# Patient Record
Sex: Female | Born: 1955 | Race: White | Hispanic: No | Marital: Married | Smoking: Never smoker
Health system: Southern US, Community
[De-identification: ages and names within clinical notes are randomized; demographics above are authoritative.]

## PROBLEM LIST (undated history)

## (undated) DIAGNOSIS — I10 Essential (primary) hypertension: Secondary | ICD-10-CM

## (undated) DIAGNOSIS — E785 Hyperlipidemia, unspecified: Secondary | ICD-10-CM

## (undated) DIAGNOSIS — E119 Type 2 diabetes mellitus without complications: Secondary | ICD-10-CM

## (undated) HISTORY — DX: Type 2 diabetes mellitus without complications: E11.9

## (undated) HISTORY — DX: Hyperlipidemia, unspecified: E78.5

---

## 1999-01-21 ENCOUNTER — Other Ambulatory Visit: Admission: RE | Admit: 1999-01-21 | Discharge: 1999-01-21 | Payer: Self-pay | Admitting: Obstetrics and Gynecology

## 2000-04-19 ENCOUNTER — Other Ambulatory Visit: Admission: RE | Admit: 2000-04-19 | Discharge: 2000-04-19 | Payer: Self-pay | Admitting: Obstetrics and Gynecology

## 2000-04-21 ENCOUNTER — Encounter: Payer: Self-pay | Admitting: Obstetrics and Gynecology

## 2000-04-21 ENCOUNTER — Encounter: Admission: RE | Admit: 2000-04-21 | Discharge: 2000-04-21 | Payer: Self-pay | Admitting: Obstetrics and Gynecology

## 2001-05-28 ENCOUNTER — Other Ambulatory Visit: Admission: RE | Admit: 2001-05-28 | Discharge: 2001-05-28 | Payer: Self-pay | Admitting: Obstetrics and Gynecology

## 2001-06-25 ENCOUNTER — Encounter: Admission: RE | Admit: 2001-06-25 | Discharge: 2001-06-25 | Payer: Self-pay | Admitting: Obstetrics and Gynecology

## 2001-06-25 ENCOUNTER — Encounter: Payer: Self-pay | Admitting: Obstetrics and Gynecology

## 2001-11-20 ENCOUNTER — Encounter: Payer: Self-pay | Admitting: Family Medicine

## 2001-11-20 ENCOUNTER — Encounter: Admission: RE | Admit: 2001-11-20 | Discharge: 2001-11-20 | Payer: Self-pay | Admitting: Family Medicine

## 2002-08-08 ENCOUNTER — Encounter: Admission: RE | Admit: 2002-08-08 | Discharge: 2002-08-08 | Payer: Self-pay | Admitting: Obstetrics and Gynecology

## 2002-08-08 ENCOUNTER — Encounter: Payer: Self-pay | Admitting: Obstetrics and Gynecology

## 2002-10-16 ENCOUNTER — Other Ambulatory Visit: Admission: RE | Admit: 2002-10-16 | Discharge: 2002-10-16 | Payer: Self-pay | Admitting: Obstetrics and Gynecology

## 2003-10-31 ENCOUNTER — Encounter: Admission: RE | Admit: 2003-10-31 | Discharge: 2003-10-31 | Payer: Self-pay | Admitting: Obstetrics and Gynecology

## 2003-11-27 ENCOUNTER — Other Ambulatory Visit: Admission: RE | Admit: 2003-11-27 | Discharge: 2003-11-27 | Payer: Self-pay | Admitting: Obstetrics and Gynecology

## 2004-04-19 ENCOUNTER — Encounter: Admission: RE | Admit: 2004-04-19 | Discharge: 2004-05-17 | Payer: Self-pay

## 2005-04-06 ENCOUNTER — Encounter: Admission: RE | Admit: 2005-04-06 | Discharge: 2005-04-06 | Payer: Self-pay | Admitting: Obstetrics and Gynecology

## 2005-04-07 ENCOUNTER — Other Ambulatory Visit: Admission: RE | Admit: 2005-04-07 | Discharge: 2005-04-07 | Payer: Self-pay | Admitting: Obstetrics and Gynecology

## 2005-04-11 ENCOUNTER — Ambulatory Visit (HOSPITAL_COMMUNITY): Admission: RE | Admit: 2005-04-11 | Discharge: 2005-04-11 | Payer: Self-pay | Admitting: Obstetrics and Gynecology

## 2005-04-11 IMAGING — US US TRANSVAGINAL NON-OB
1 series · 14 of 25 positions shown · non-contrast
Comparison: none

CLINICAL DATA: Right lower quadrant pain.  Not pregnant.
 TRANSABDOMINAL AND TRANSVAGINAL PELVIC ULTRASOUND:
TECHNIQUE: Both transabdominal and transvaginal ultrasound examinations of the pelvis were performed including evaluation of the uterus, ovaries, adnexal regions, and pelvic cul-de-sac.
 The uterus measures 8.2 x 4.4 x 3.6 cm and contains two predominantly intramural fibroids in the anterior uterine body, the larger of which measures 1.1 x 0.9 x 0.9 cm.  No appreciable mass effect upon the endometrial stripe is seen.  The endometrial stripe measures 9 mm and is linear and echogenic in appearance.  There is a 1.8 x 1.7 cm thick-walled right ovarian cyst with low-level internal echoes.  Trace fluid is noted around the right ovary.  The left ovary is normal  No free fluid in the cul-de-sac.

[Series 1: us transvaginal non-ob · 0.31mm/px · 14 of 52 slices shown]
[im 1/52]
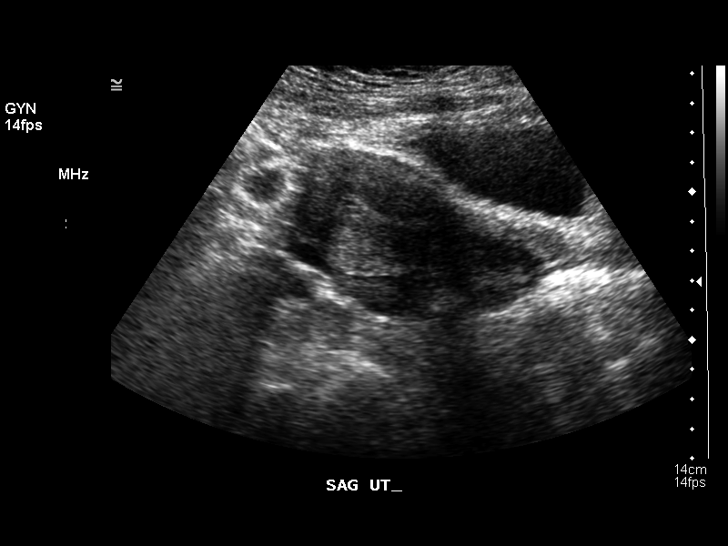
[im 5/52]
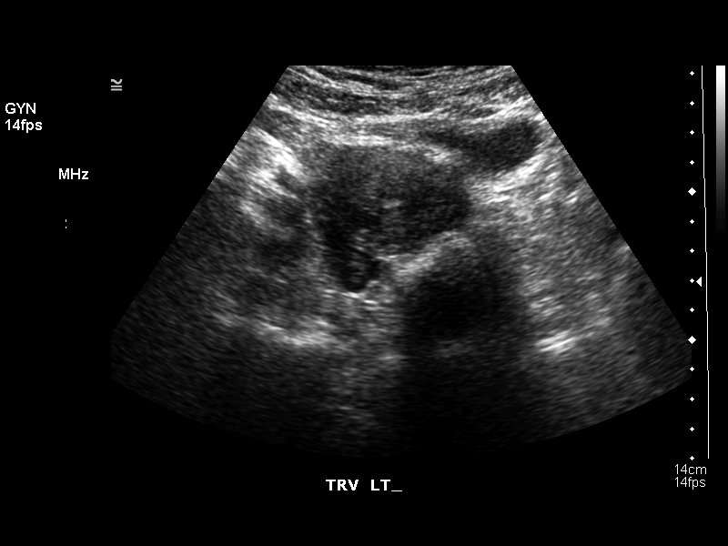
[im 9/52]
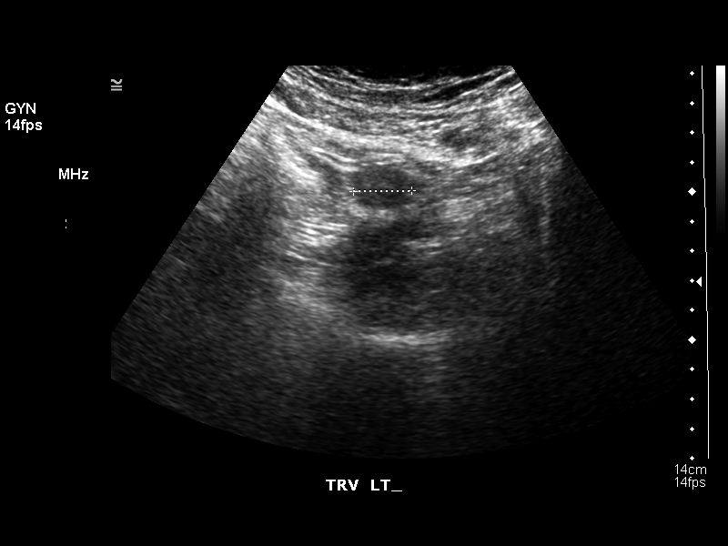
[im 13/52]
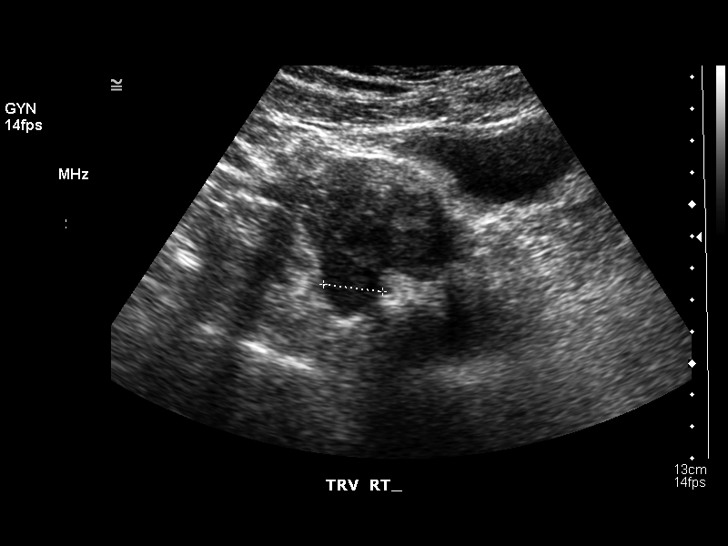
[im 18/52]
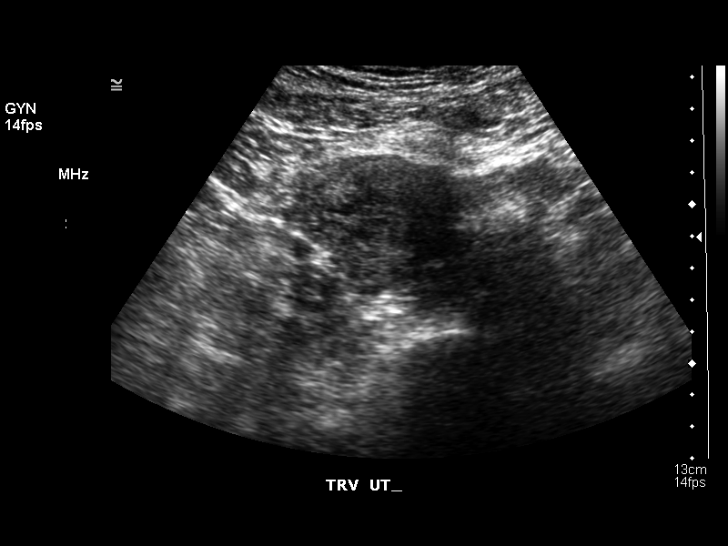
[im 20/52]
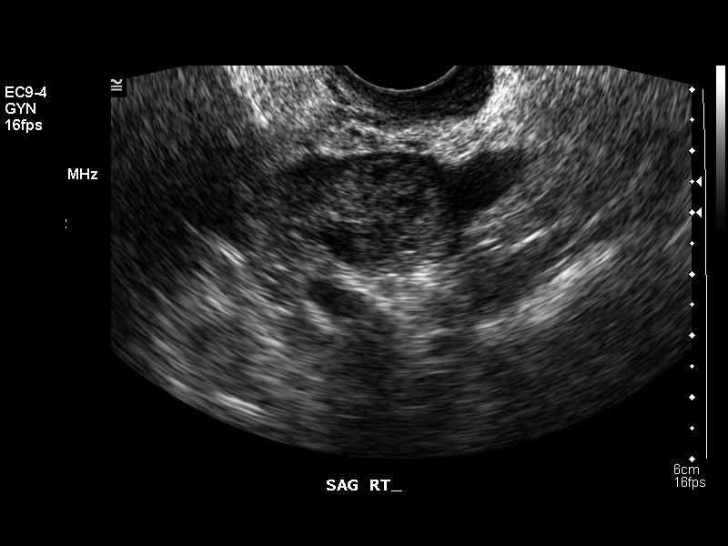
[im 24/52]
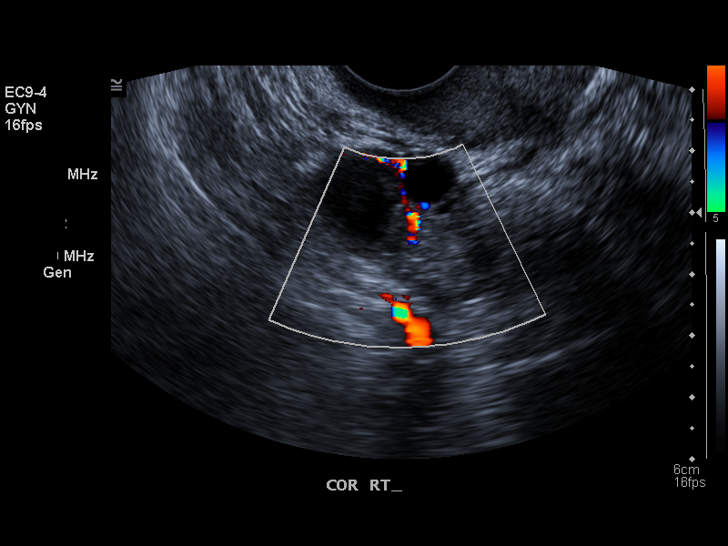
[im 28/52]
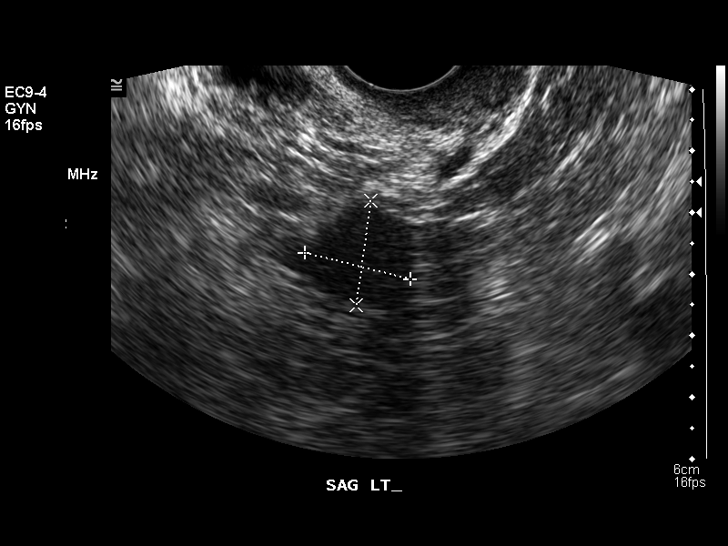
[im 32/52]
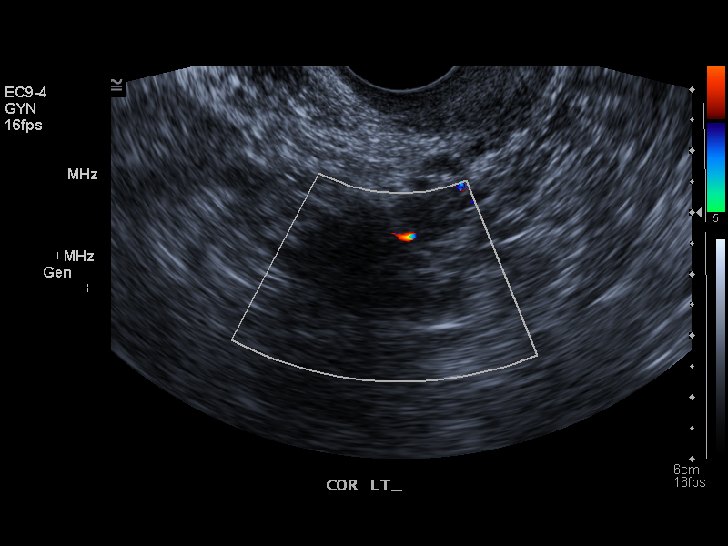
[im 35/52]
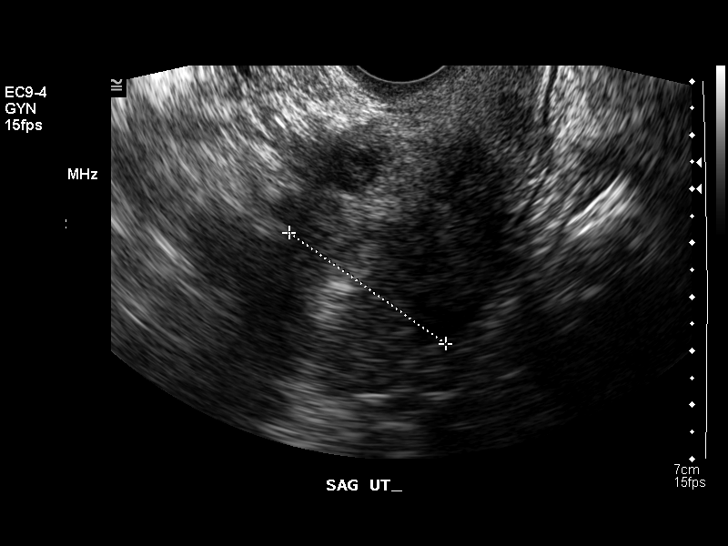
[im 39/52]
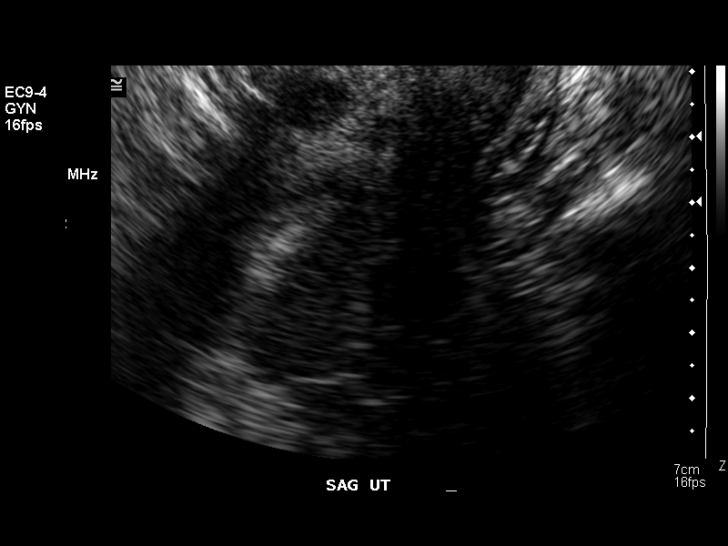
[im 43/52]
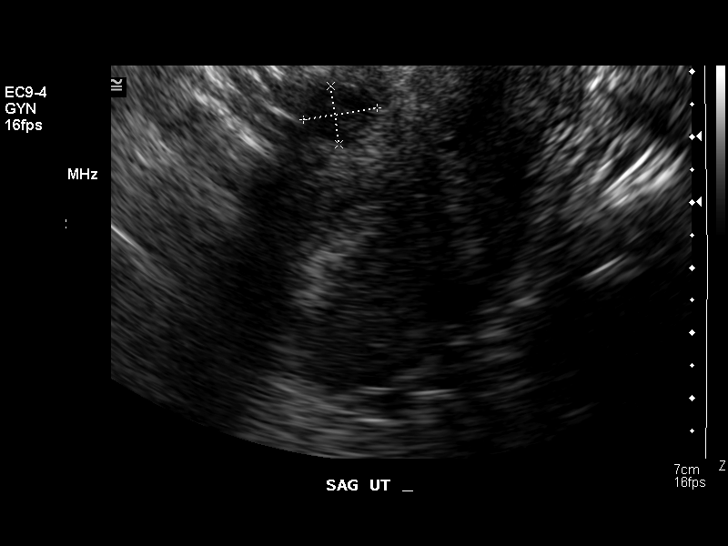
[im 47/52]
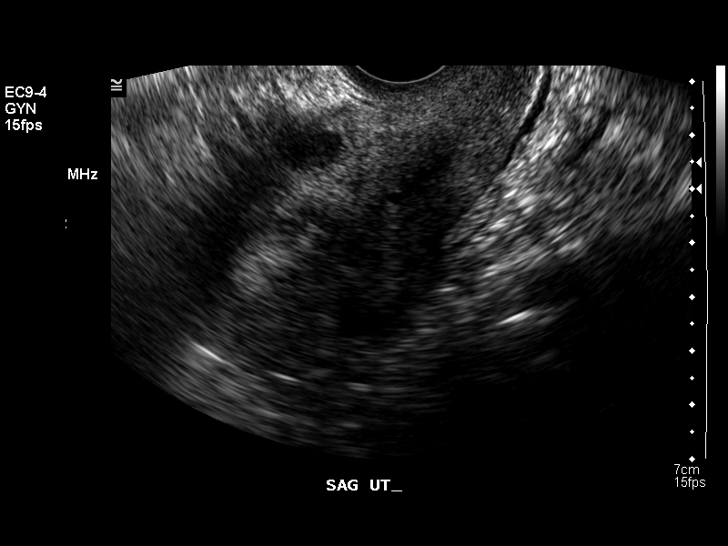
[im 52/52]
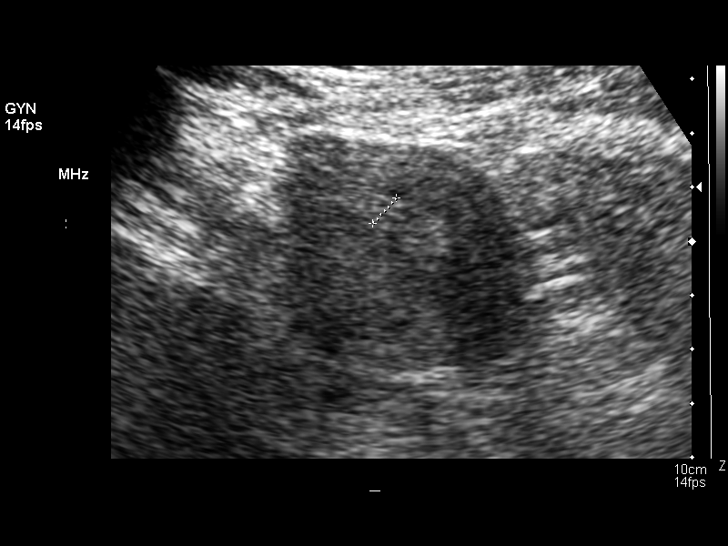

[14 of 25 positions shown; findings below may reference images not displayed]

IMPRESSION: 1.  1.8 x 1.7 cm complex right ovarian cyst with trace periovarian fluid; as the patient?s last menstrual period was [DATE], this may represent sequela of cyst rupture.  
 2.  Intramural uterine fibroids.

## 2005-06-10 ENCOUNTER — Ambulatory Visit (HOSPITAL_COMMUNITY): Admission: RE | Admit: 2005-06-10 | Discharge: 2005-06-10 | Payer: Self-pay | Admitting: Obstetrics and Gynecology

## 2005-06-10 IMAGING — US US TRANSVAGINAL NON-OB
1 series · 14 of 25 positions shown · non-contrast
Comparison: [DATE].

CLINICAL DATA: Right ovarian cyst.
 TRANSVAGINAL PELVIC ULTRASOUND:
TECHNIQUE: Transvaginal ultrasound examination of the pelvis was performed including evaluation of the uterus, ovaries, adnexal regions, and pelvic cul-de-sac.

[Series 1: us transvaginal non-ob · 0.17mm/px · 14 of 36 slices shown]
[im 1/36]
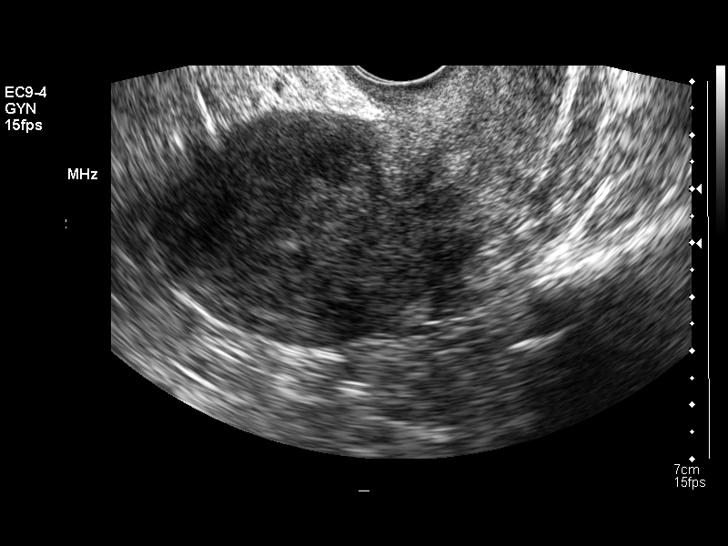
[im 3/36]
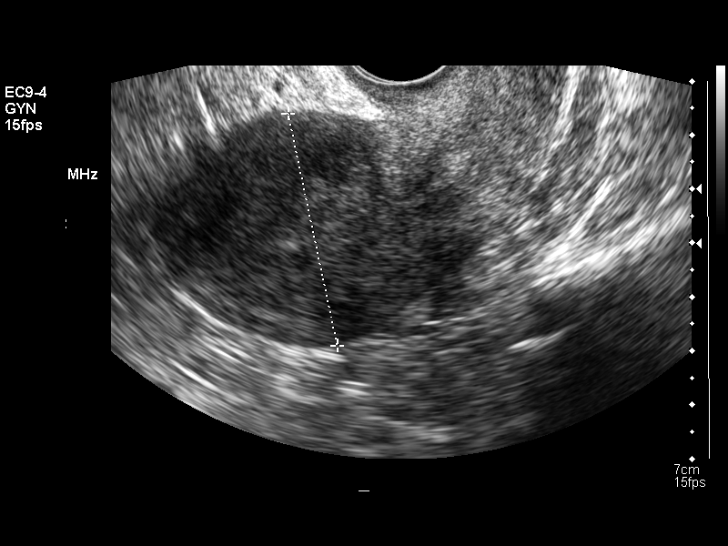
[im 6/36]
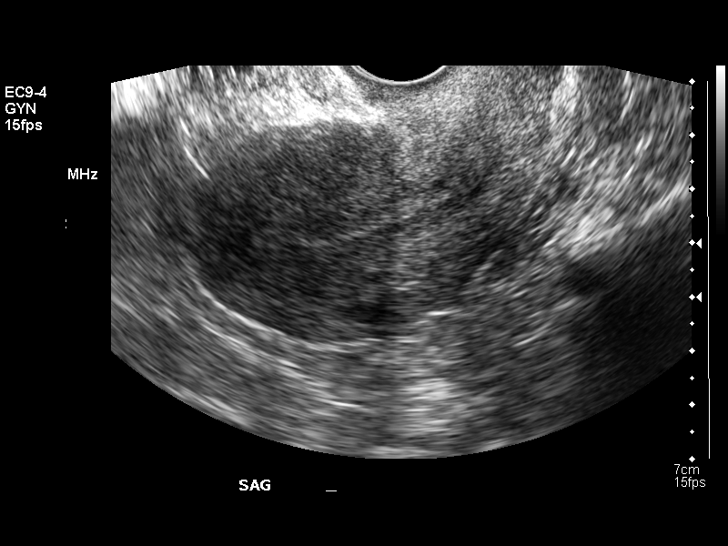
[im 9/36]
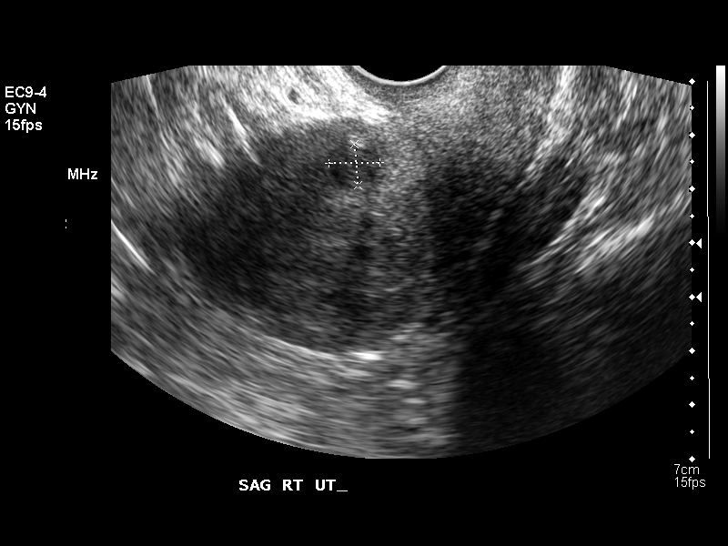
[im 12/36]
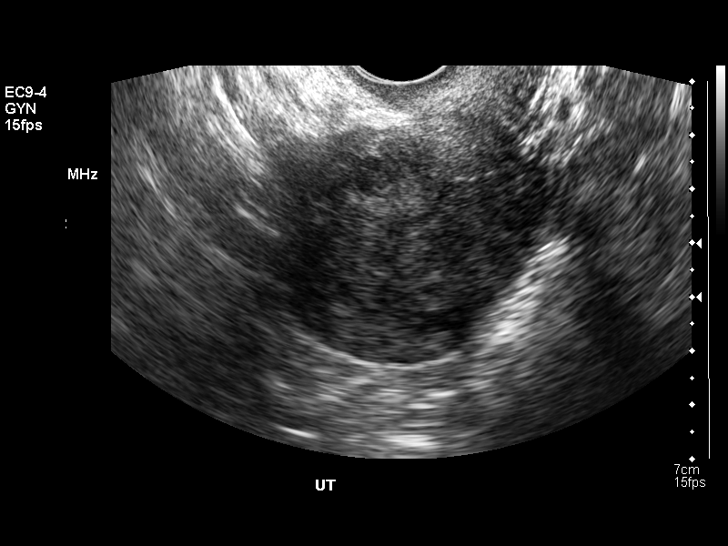
[im 14/36]
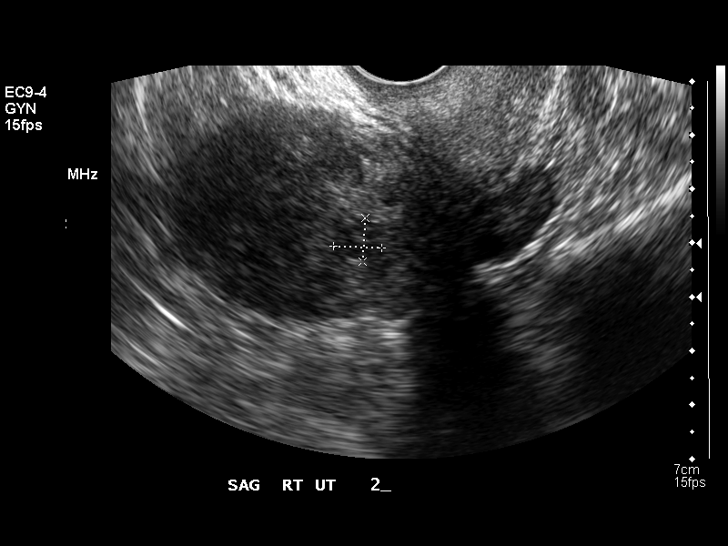
[im 17/36]
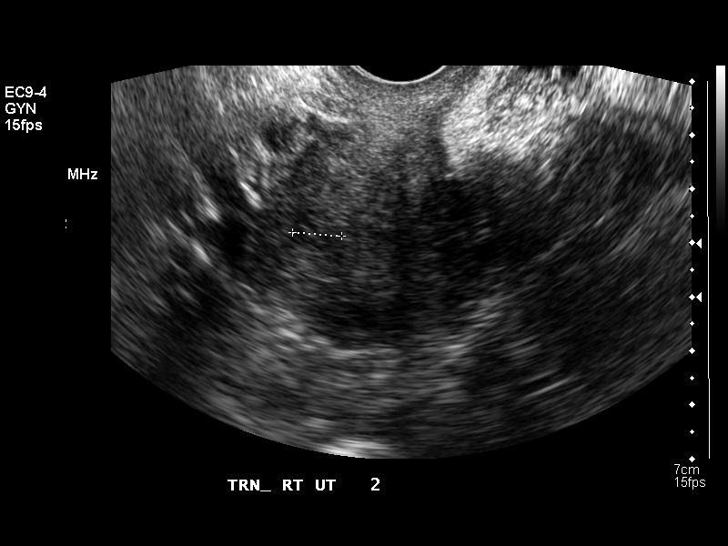
[im 19/36]
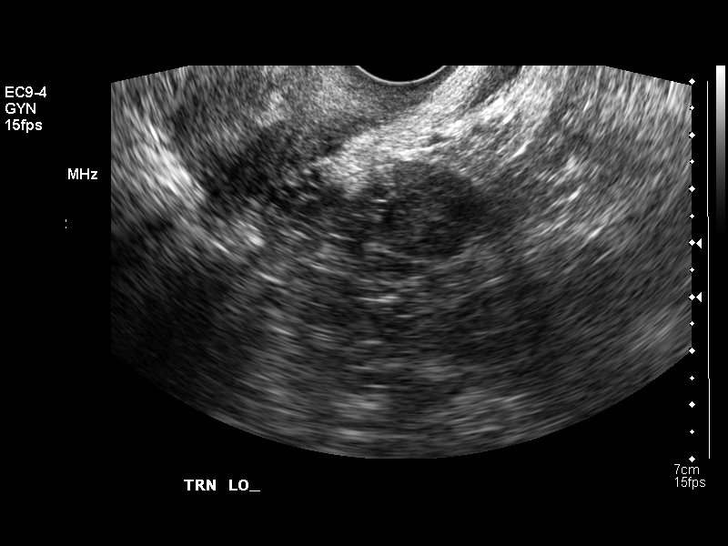
[im 22/36]
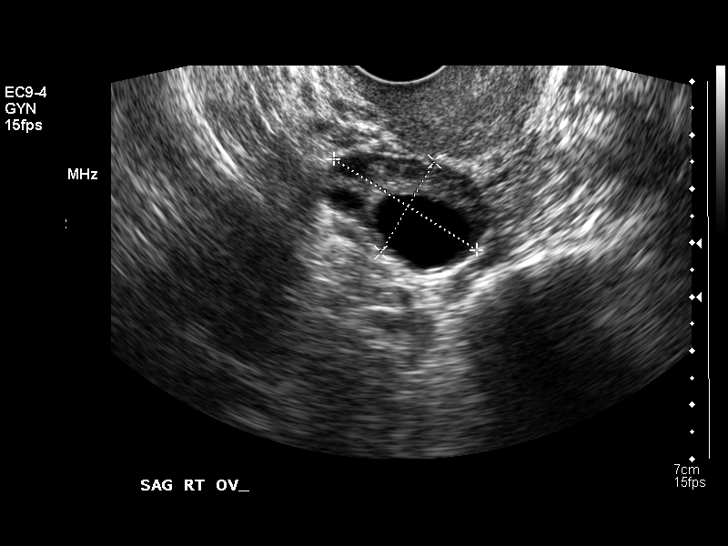
[im 24/36]
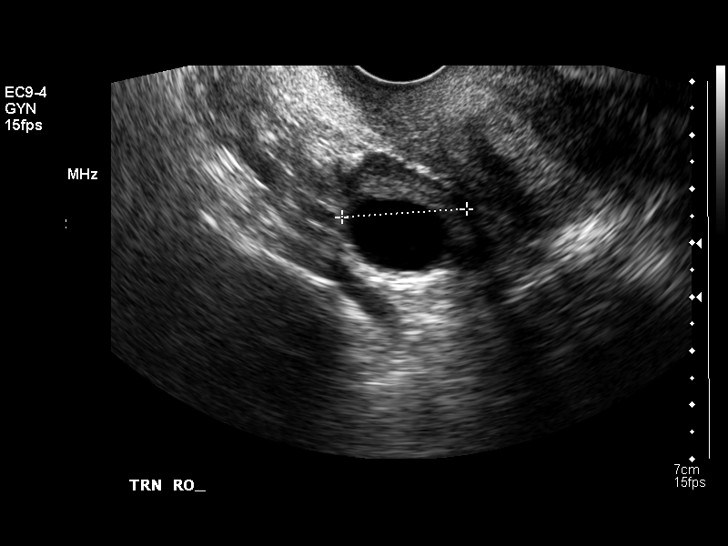
[im 27/36]
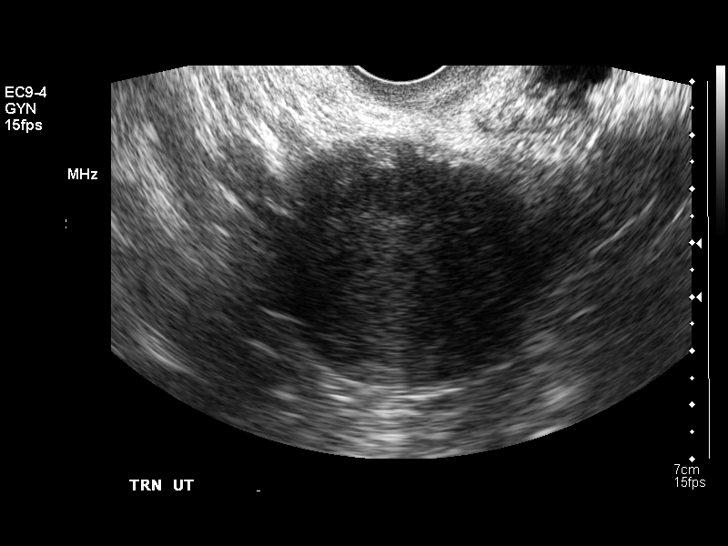
[im 30/36]
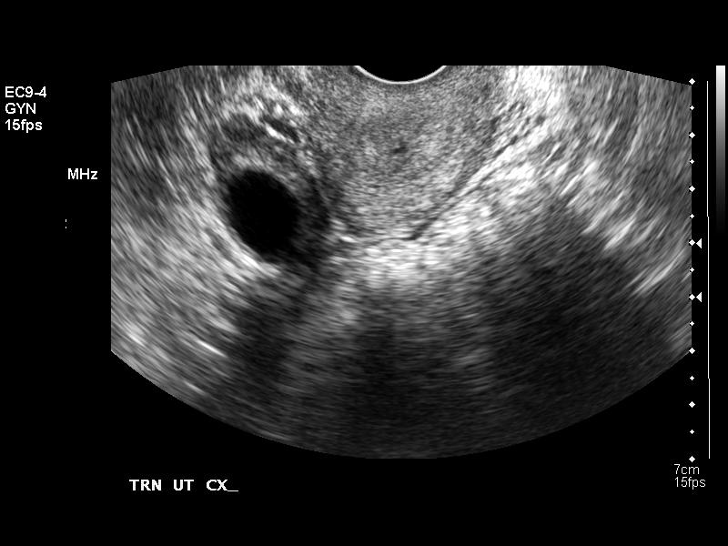
[im 33/36]
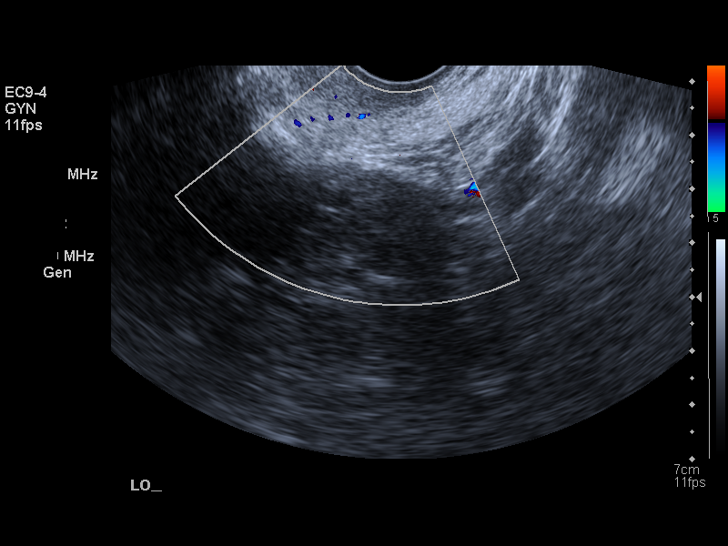
[im 36/36]
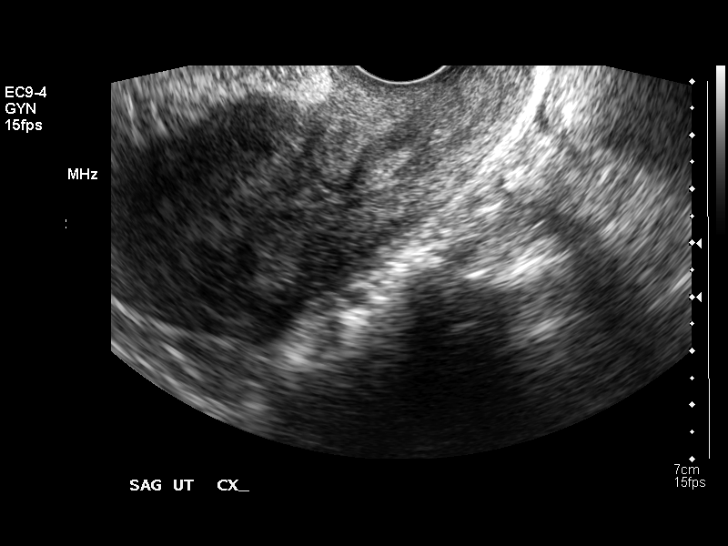

[14 of 25 positions shown; findings below may reference images not displayed]

FINDINGS: The uterus measures 8.2 x 4.4 x 4.2 cm.  The two small intramural fibroids seen previously persist. Endometrial stripe is 4-5 mm in thickness.  
 The right ovary is 2.1 x 2.0 x 2.3 cm.  A 17 mm simple cystic region within the ovarian parenchyma is compatible with a dominant follicle.  The left ovary measures 2.9 x 1.9 x 1.9 cm.  Sonographic appearance is normal.  There is no free fluid in the cul-de-sac.
IMPRESSION: 1.  Two small intramural fibroids.  The exam is otherwise normal.
 2.  The complex cyst in the right ovary seen previously has resolved with a probable dominant follicle centrally within the ovarian parenchyma today.

## 2005-07-08 ENCOUNTER — Encounter: Admission: RE | Admit: 2005-07-08 | Discharge: 2005-09-01 | Payer: Self-pay | Admitting: Family Medicine

## 2006-03-12 ENCOUNTER — Emergency Department (HOSPITAL_COMMUNITY): Admission: EM | Admit: 2006-03-12 | Discharge: 2006-03-12 | Payer: Self-pay | Admitting: Family Medicine

## 2006-04-07 ENCOUNTER — Encounter: Admission: RE | Admit: 2006-04-07 | Discharge: 2006-04-07 | Payer: Self-pay | Admitting: Obstetrics and Gynecology

## 2006-04-10 ENCOUNTER — Other Ambulatory Visit: Admission: RE | Admit: 2006-04-10 | Discharge: 2006-04-10 | Payer: Self-pay | Admitting: Obstetrics and Gynecology

## 2007-01-09 ENCOUNTER — Ambulatory Visit (HOSPITAL_COMMUNITY): Admission: RE | Admit: 2007-01-09 | Discharge: 2007-01-09 | Payer: Self-pay | Admitting: Gastroenterology

## 2007-01-09 ENCOUNTER — Encounter (INDEPENDENT_AMBULATORY_CARE_PROVIDER_SITE_OTHER): Payer: Self-pay | Admitting: Gastroenterology

## 2007-04-12 ENCOUNTER — Other Ambulatory Visit: Admission: RE | Admit: 2007-04-12 | Discharge: 2007-04-12 | Payer: Self-pay | Admitting: Obstetrics and Gynecology

## 2007-04-12 ENCOUNTER — Encounter: Admission: RE | Admit: 2007-04-12 | Discharge: 2007-04-12 | Payer: Self-pay | Admitting: Obstetrics and Gynecology

## 2007-04-12 IMAGING — MG MM SCREEN MAMMOGRAM BILATERAL
4 series · 4 of 4 positions shown · non-contrast
Comparison: none

DG SCREEN MAMMOGRAM BILATERAL
Bilateral CC and MLO view(s) were taken.

DIGITAL SCREENING MAMMOGRAM WITH CAD:
The breast tissue is heterogeneously dense.  No masses or malignant type calcifications are 
identified.  Compared with prior studies.

[R CC]
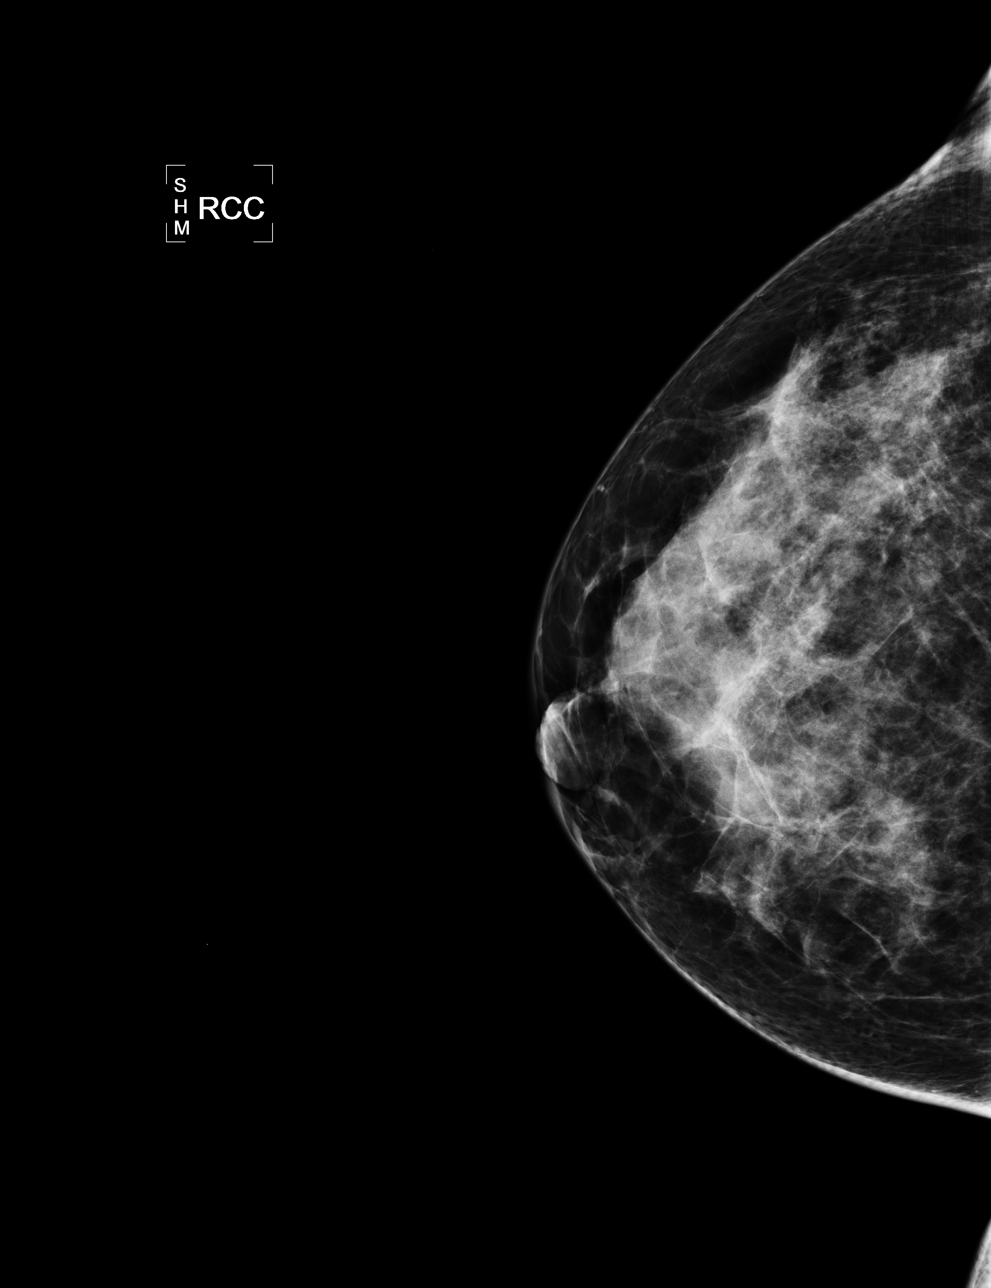

[L CC]
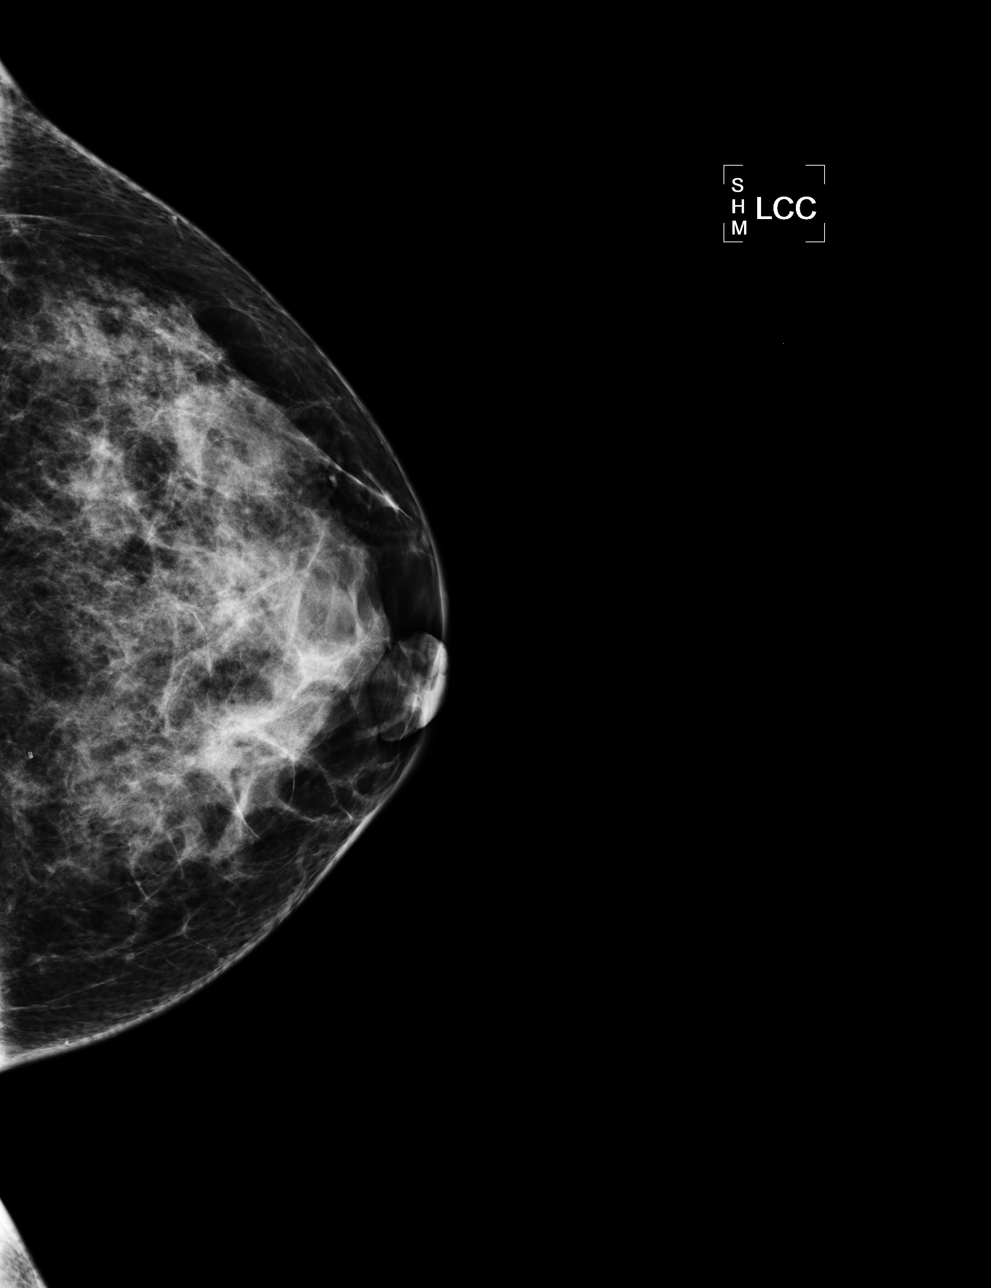

[L MLO]
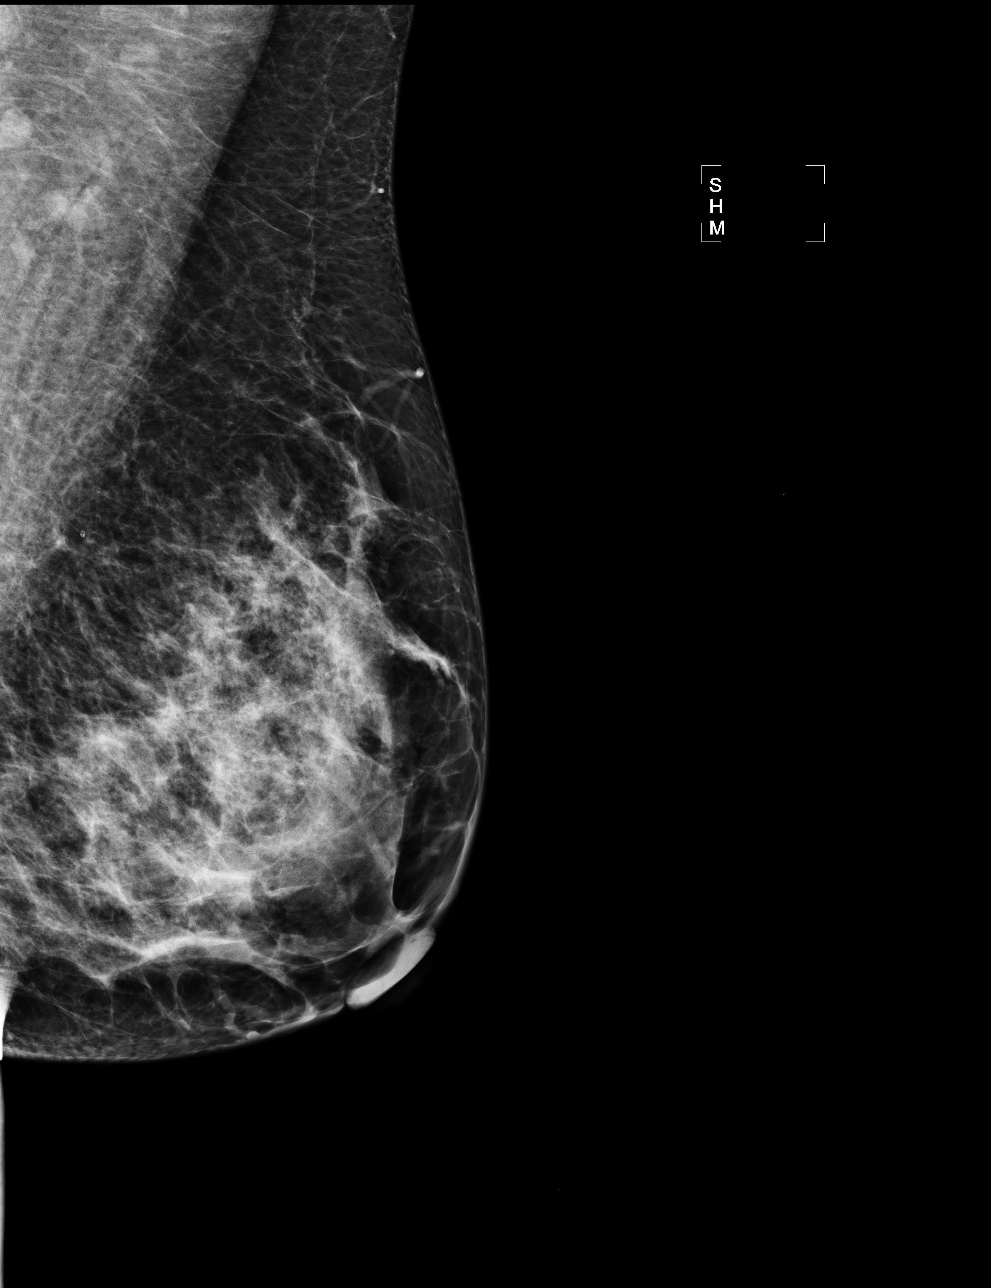

[R MLO]
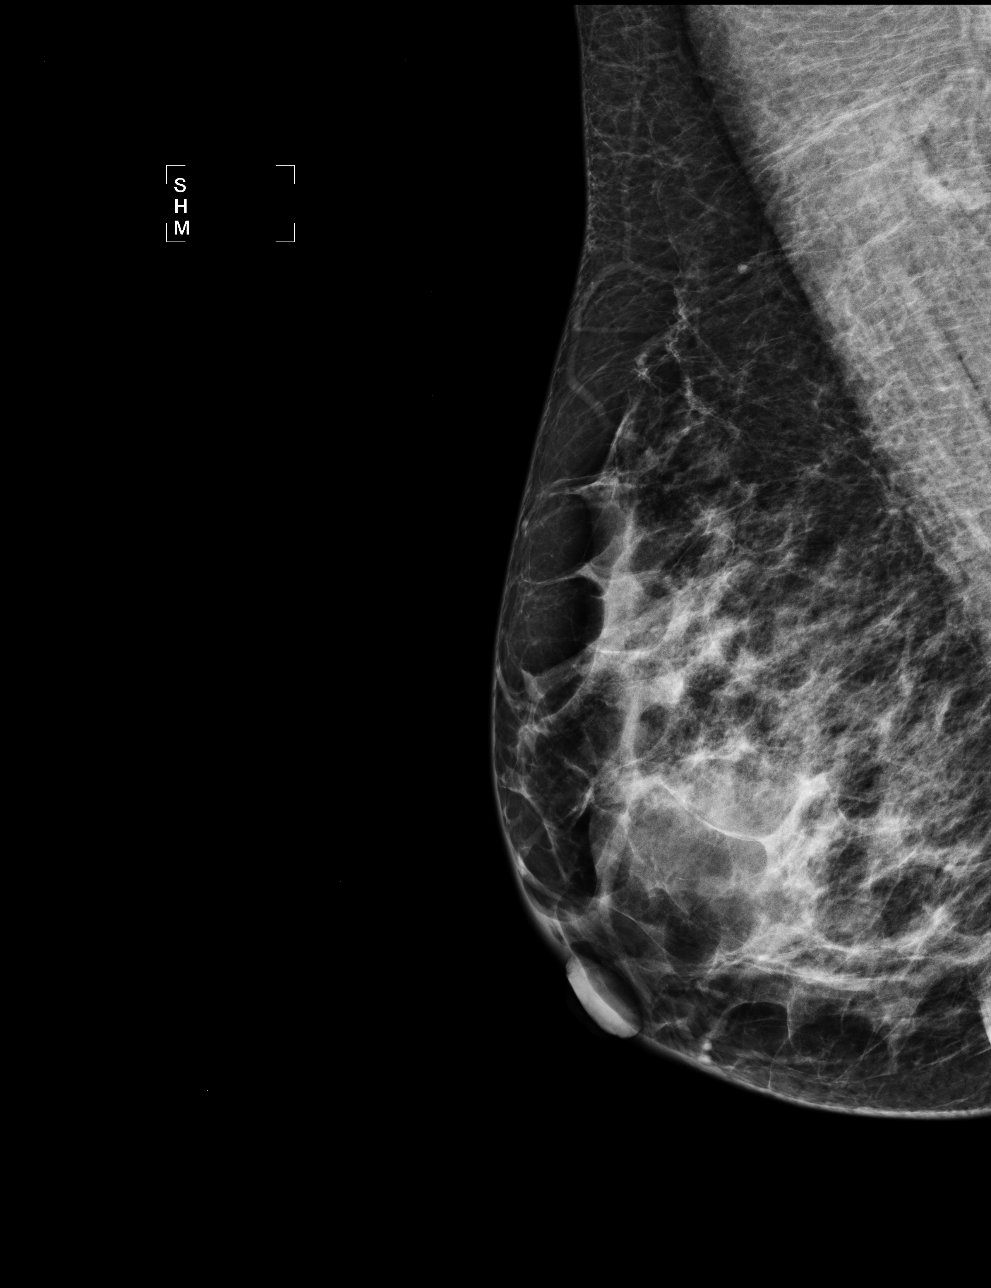

[4 of 4 positions shown; findings below may reference images not displayed]

IMPRESSION: No specific mammographic evidence of malignancy.  Next screening mammogram is recommended in one 
year.

ASSESSMENT: Negative - BI-RADS 1

Screening mammogram in 1 year.
ANALYZED BY COMPUTER AIDED DETECTION. , THIS PROCEDURE WAS A DIGITAL MAMMOGRAM.

## 2008-04-14 ENCOUNTER — Encounter: Admission: RE | Admit: 2008-04-14 | Discharge: 2008-04-14 | Payer: Self-pay | Admitting: Obstetrics and Gynecology

## 2008-04-14 IMAGING — MG MM SCREEN MAMMOGRAM BILATERAL
4 series · 4 of 4 positions shown · non-contrast
Comparison: none

DG SCREEN MAMMOGRAM BILATERAL
Bilateral CC and MLO view(s) were taken.

DIGITAL SCREENING MAMMOGRAM WITH CAD:
There are scattered fibroglandular densities.  No masses or malignant type calcifications are 
identified.  Compared with prior studies.

[R CC]
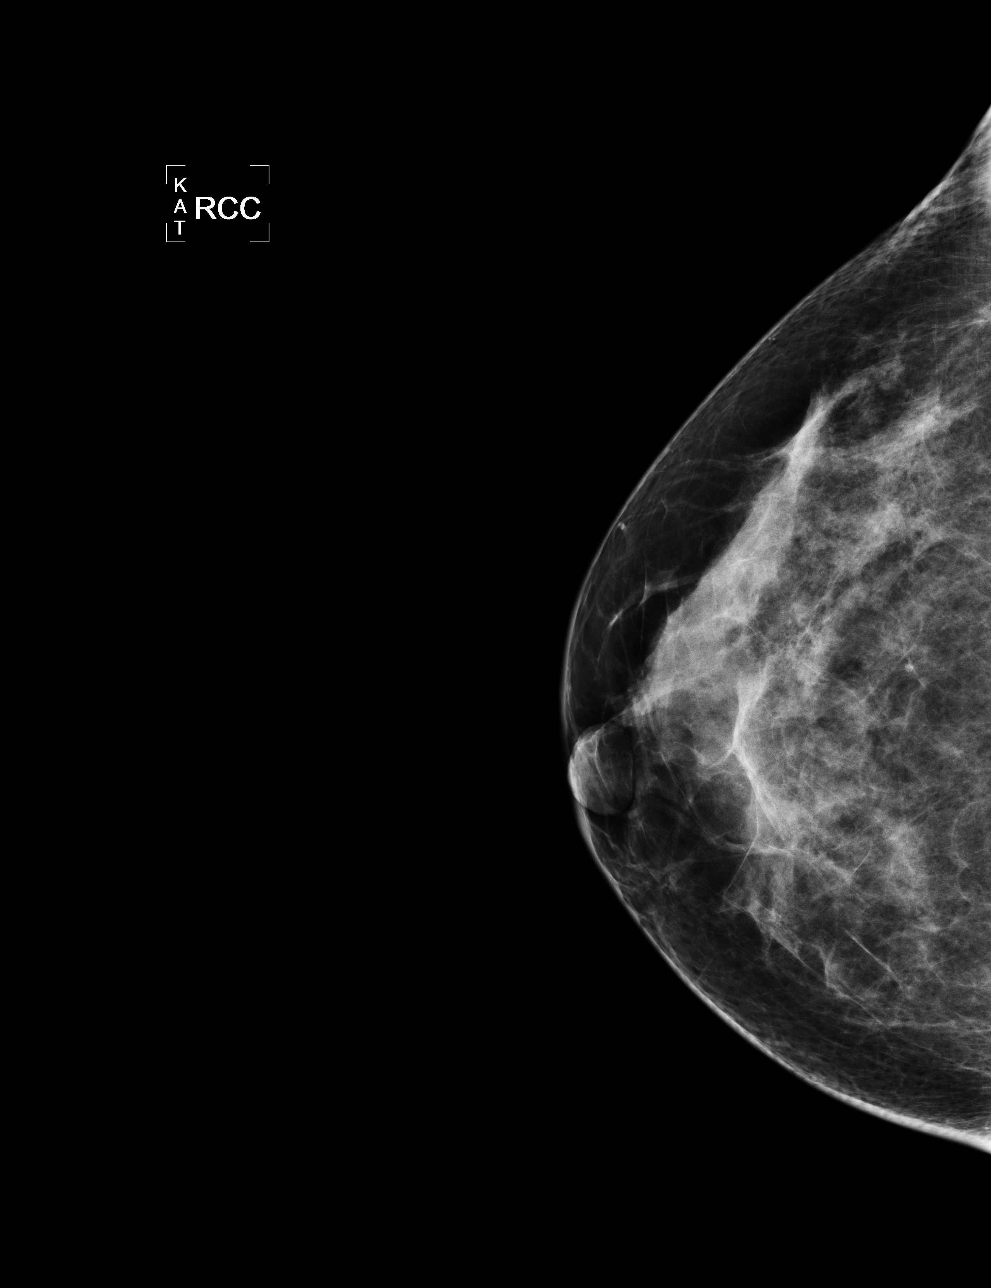

[L CC]
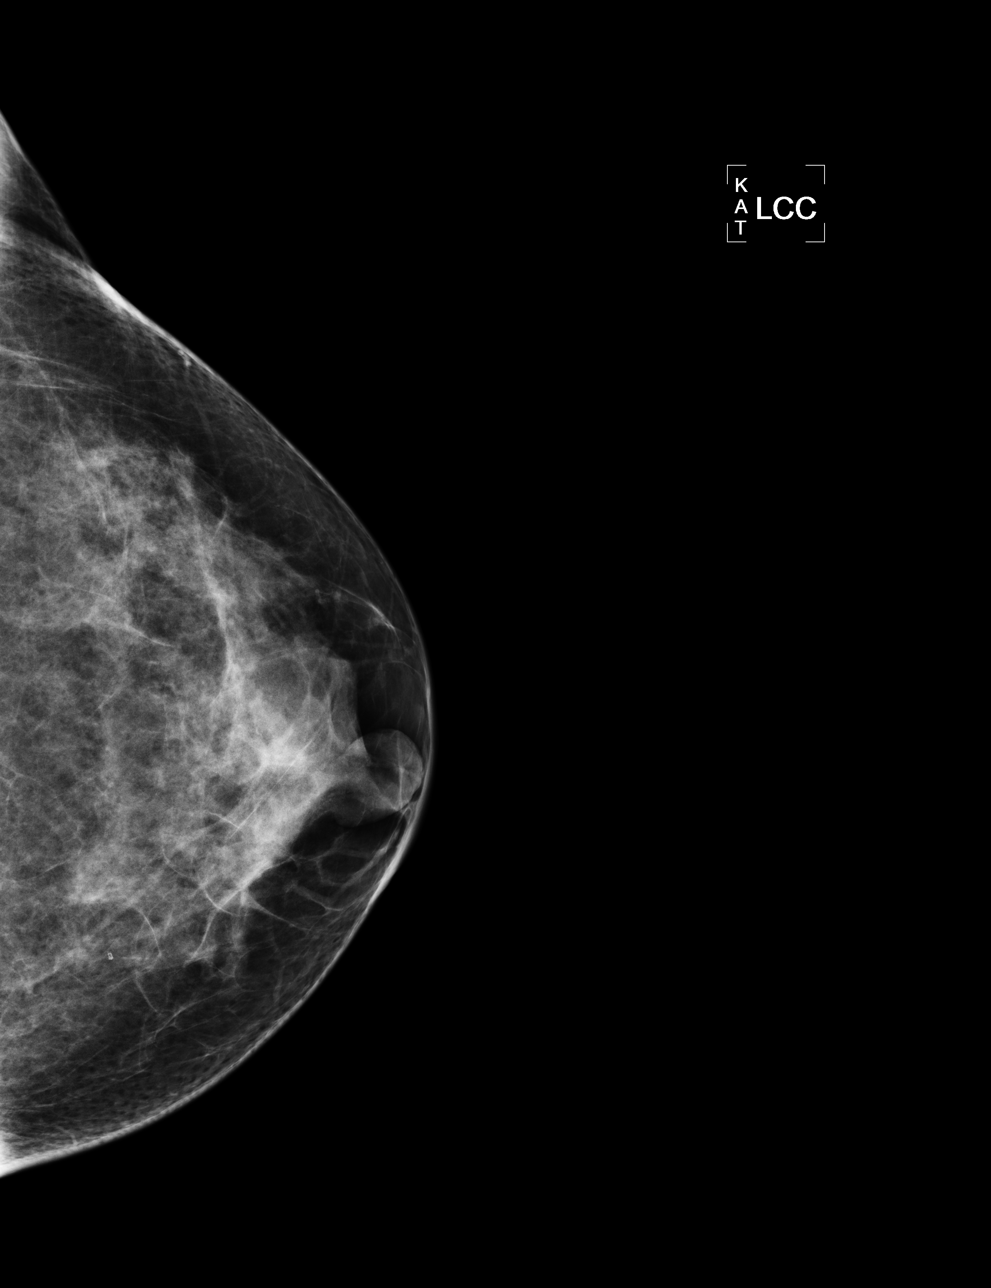

[L MLO]
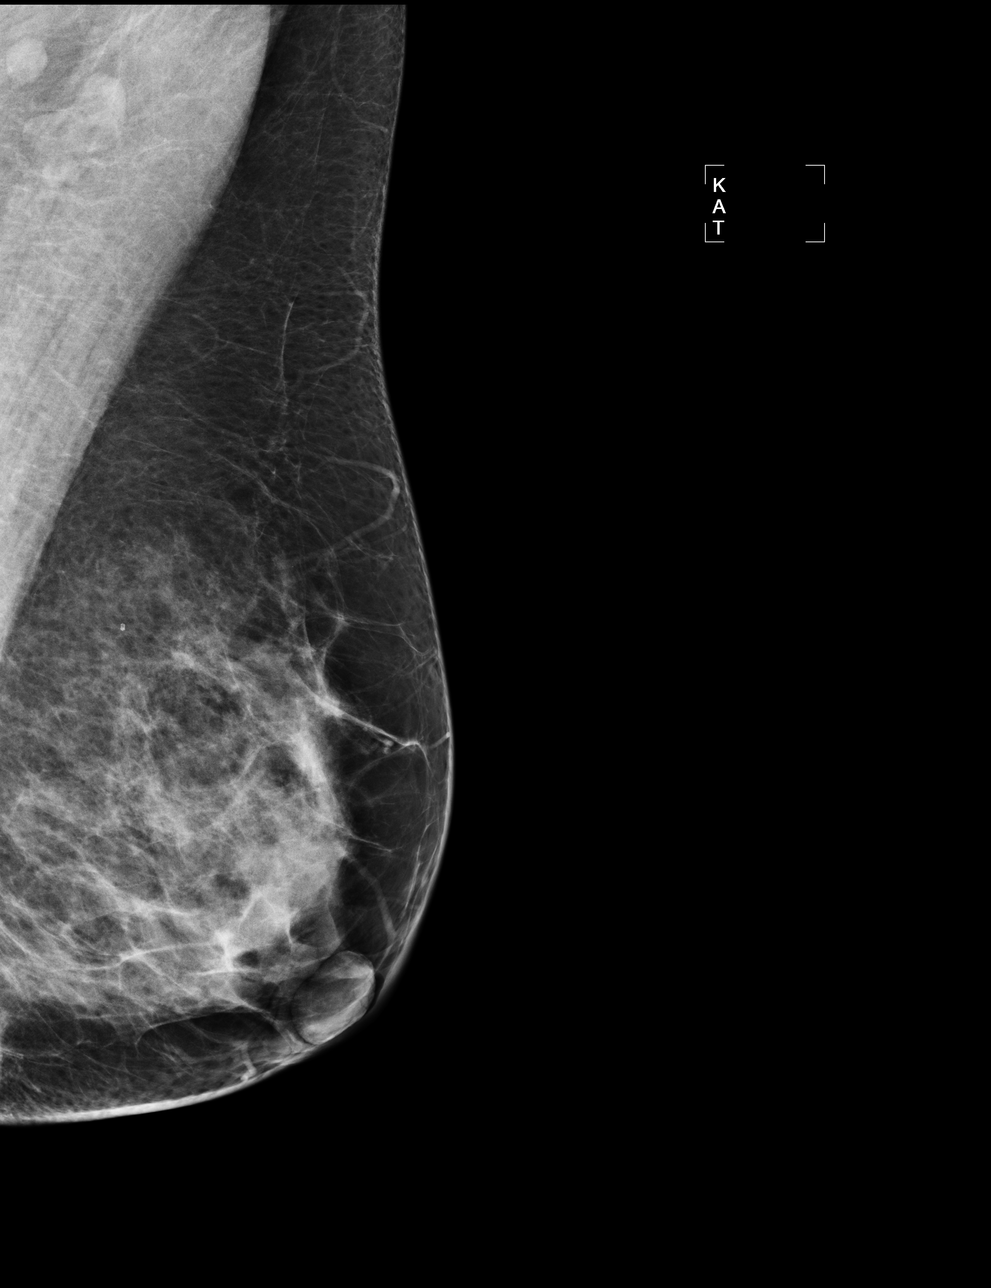

[R MLO]
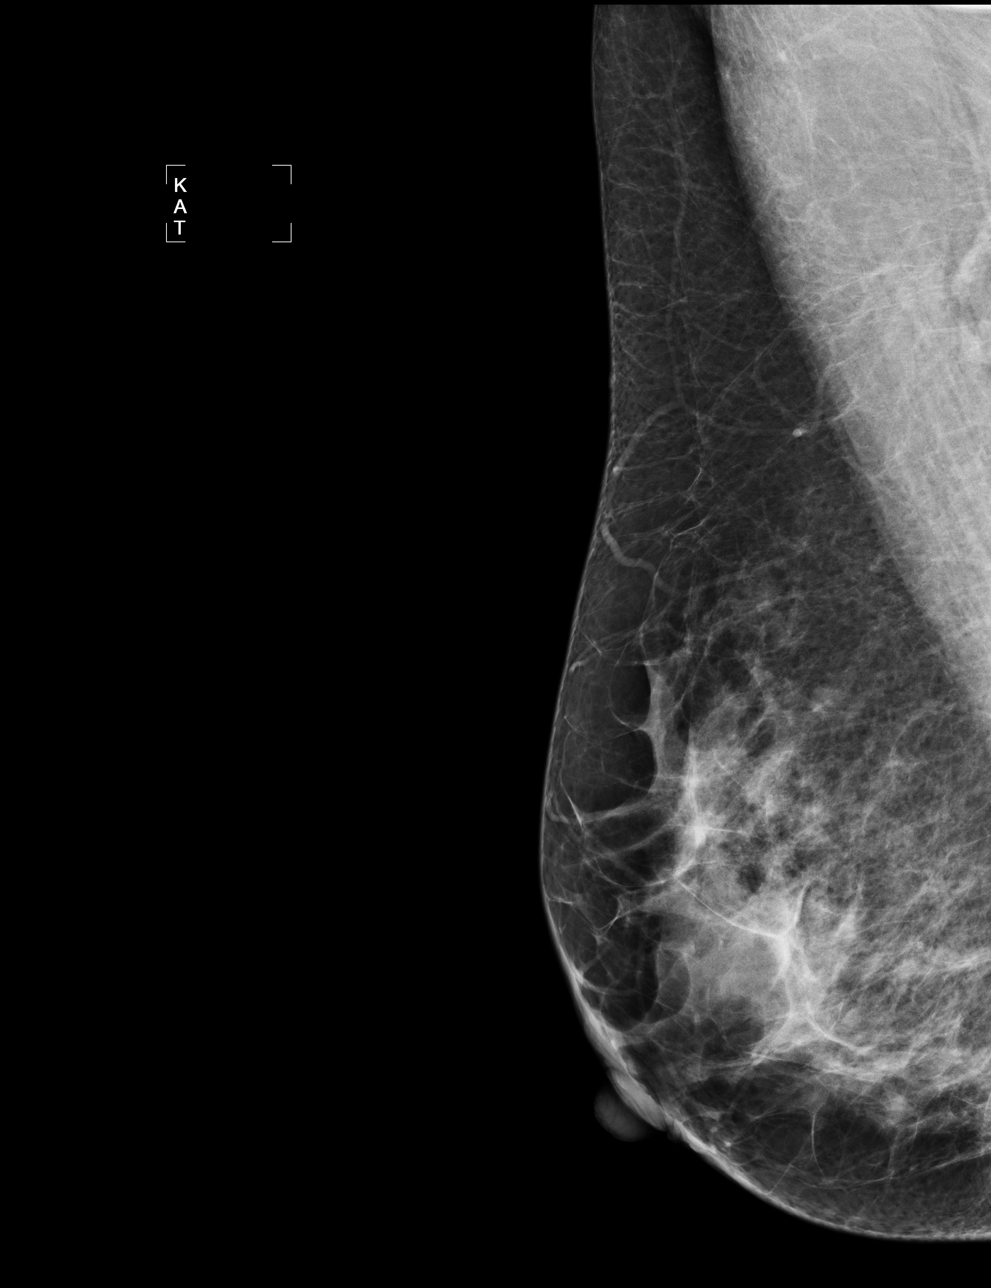

[4 of 4 positions shown; findings below may reference images not displayed]

IMPRESSION: No specific mammographic evidence of malignancy.  Next screening mammogram is recommended in one 
year.

ASSESSMENT: Negative - BI-RADS 1

Screening mammogram in 1 year.
ANALYZED BY COMPUTER AIDED DETECTION. , THIS PROCEDURE WAS A DIGITAL MAMMOGRAM.

## 2008-04-16 ENCOUNTER — Other Ambulatory Visit: Admission: RE | Admit: 2008-04-16 | Discharge: 2008-04-16 | Payer: Self-pay | Admitting: Obstetrics and Gynecology

## 2009-04-24 ENCOUNTER — Other Ambulatory Visit: Admission: RE | Admit: 2009-04-24 | Discharge: 2009-04-24 | Payer: Self-pay | Admitting: Obstetrics and Gynecology

## 2009-06-02 ENCOUNTER — Encounter: Admission: RE | Admit: 2009-06-02 | Discharge: 2009-06-02 | Payer: Self-pay | Admitting: Obstetrics and Gynecology

## 2009-06-02 IMAGING — MG MM DIGITAL SCREENING
4 series · 4 of 4 positions shown · non-contrast
Comparison: Prior studies.

DG SCREEN MAMMOGRAM BILATERAL
Bilateral CC and MLO view(s) were taken.

DIGITAL SCREENING MAMMOGRAM WITH CAD:

[R CC]
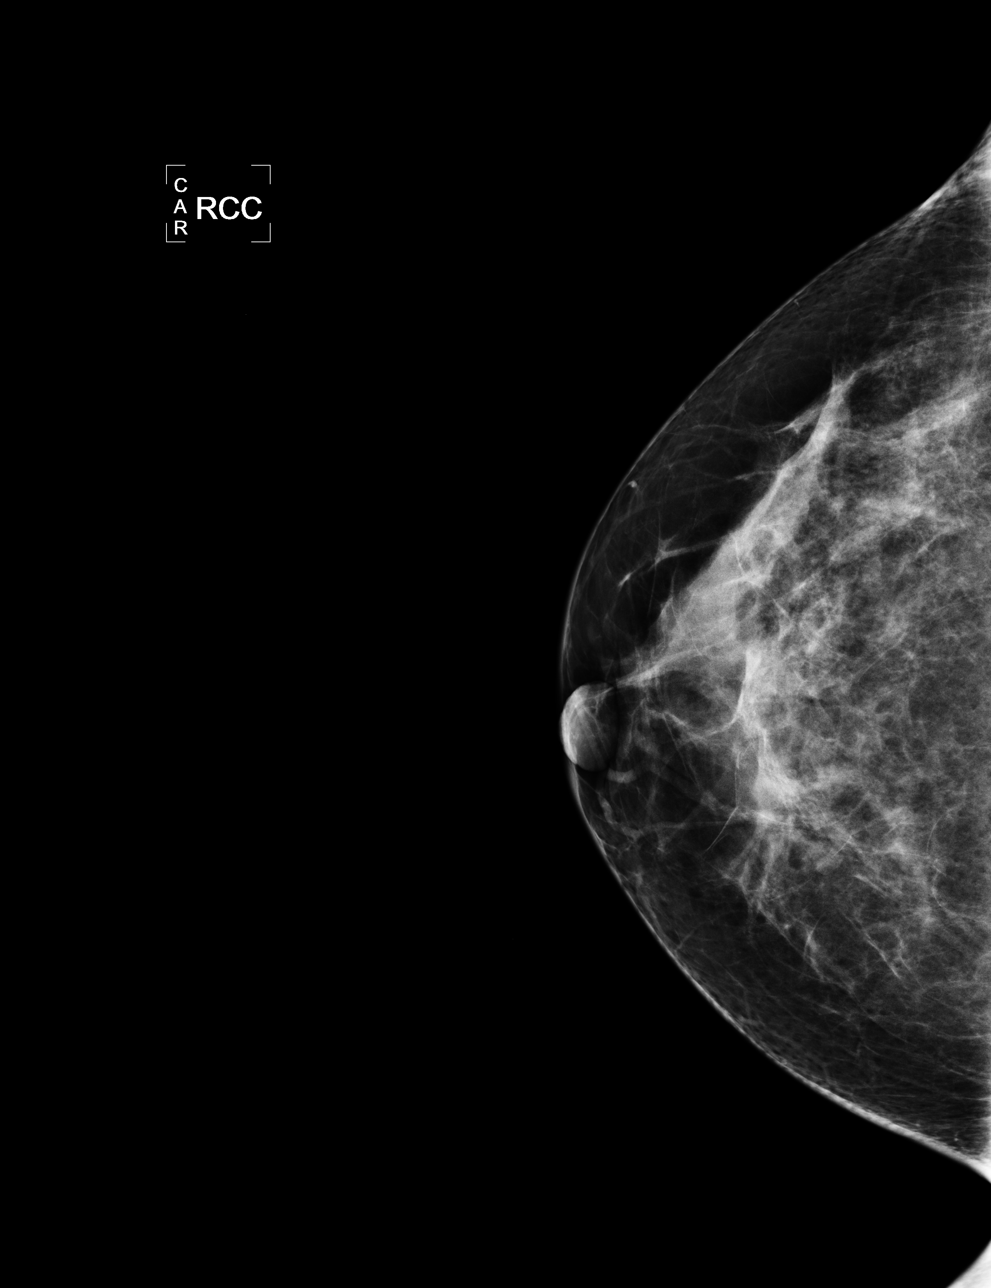

[L CC]
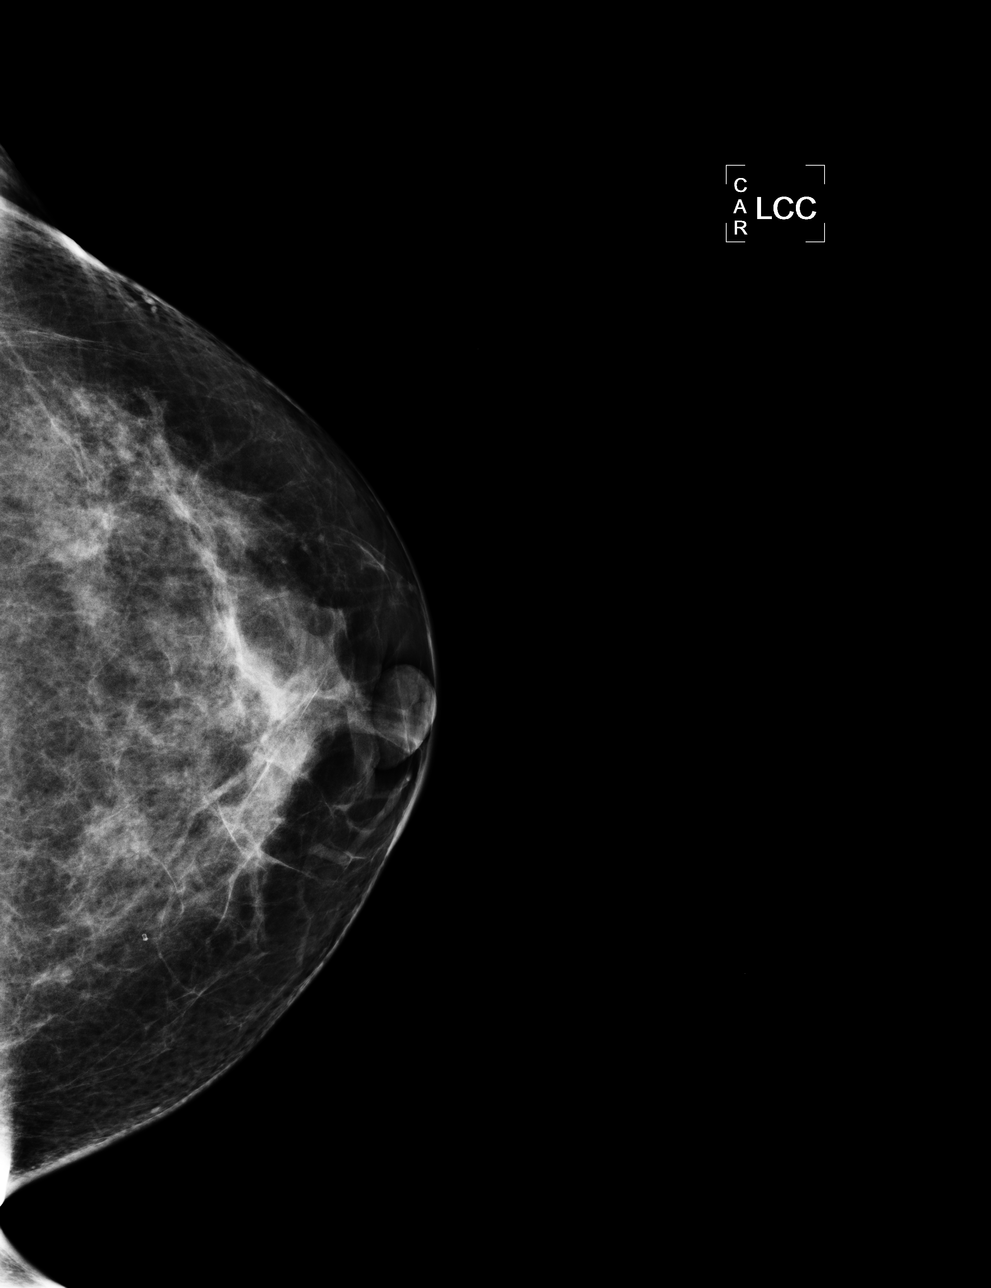

[L MLO]
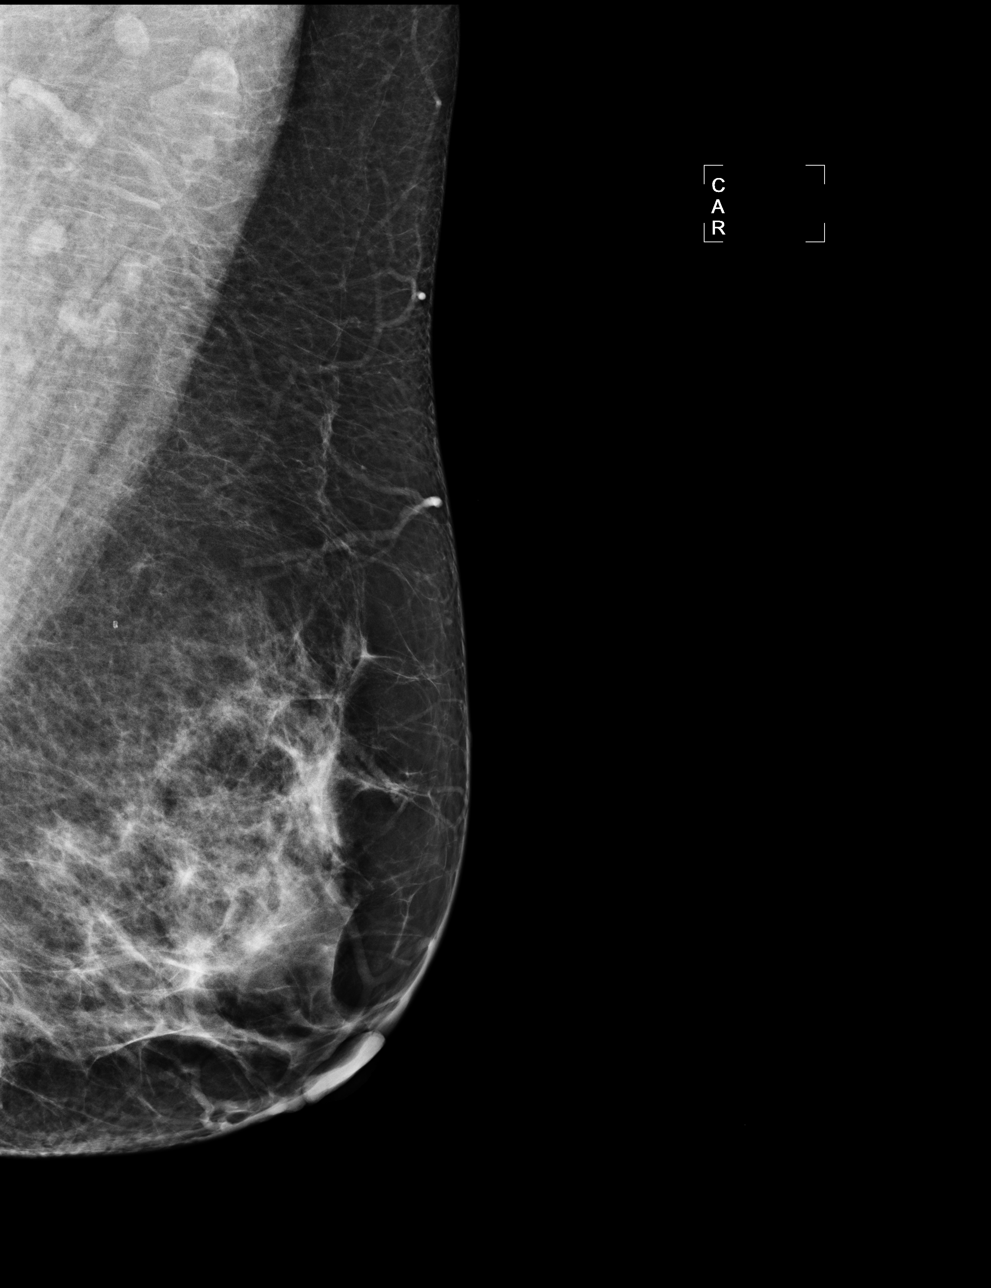

[R MLO]
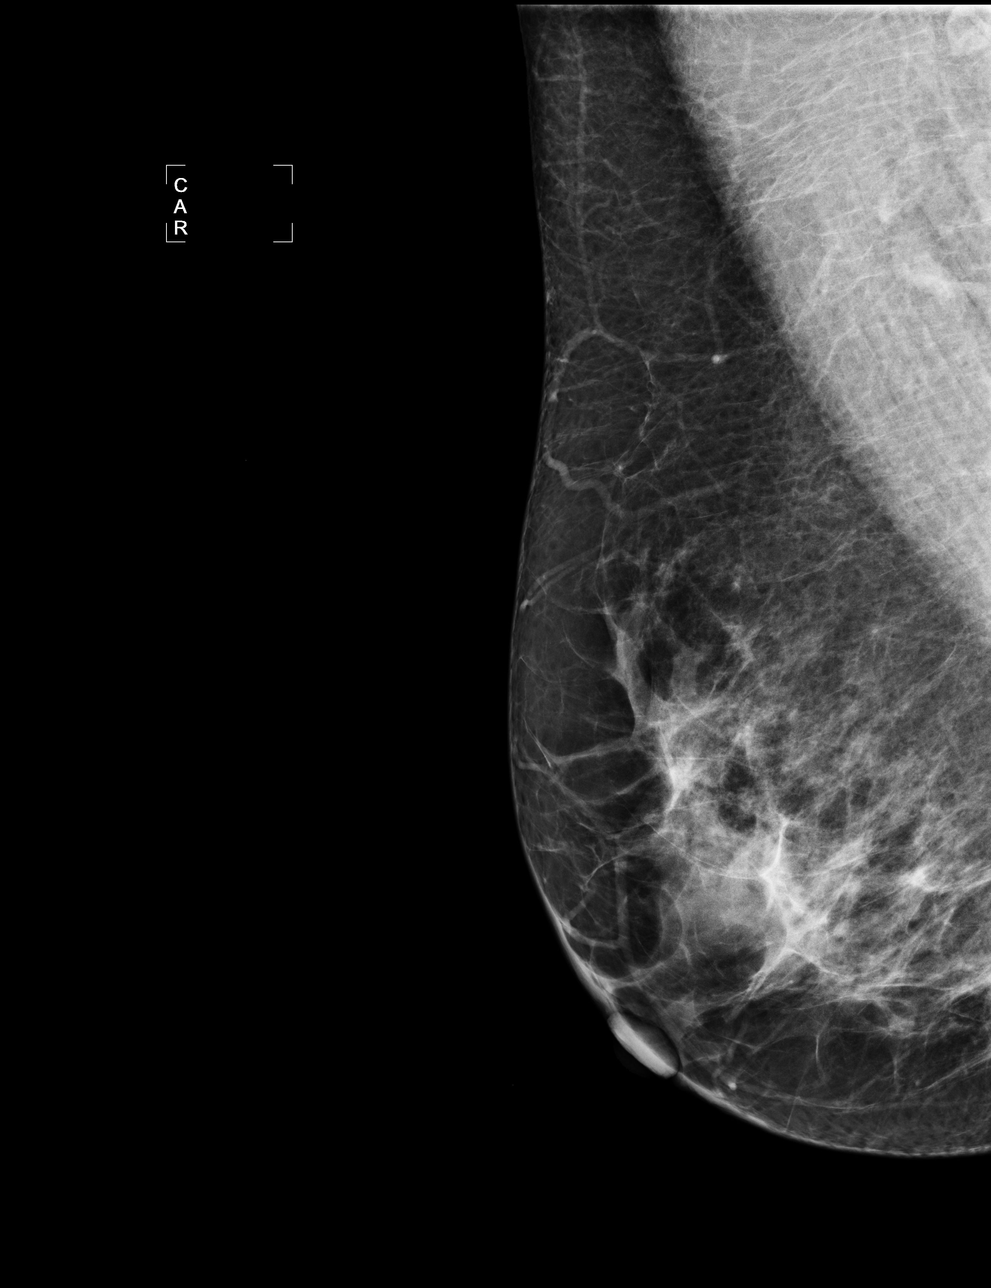

[4 of 4 positions shown; findings below may reference images not displayed]

The breast tissue is heterogeneously dense.  There is no dominant mass, architectural distortion or
calcification to suggest malignancy.

Images were processed with CAD.
IMPRESSION: No mammographic evidence of malignancy.  Suggest yearly screening mammography.

A result letter of this screening mammogram will be mailed directly to the patient.

ASSESSMENT: Negative - BI-RADS 1

Screening mammogram in 1 year.
,

## 2009-10-27 ENCOUNTER — Ambulatory Visit (HOSPITAL_BASED_OUTPATIENT_CLINIC_OR_DEPARTMENT_OTHER): Admission: RE | Admit: 2009-10-27 | Discharge: 2009-10-27 | Payer: Self-pay | Admitting: Internal Medicine

## 2009-10-31 ENCOUNTER — Ambulatory Visit: Payer: Self-pay | Admitting: Internal Medicine

## 2010-04-26 ENCOUNTER — Other Ambulatory Visit: Admission: RE | Admit: 2010-04-26 | Discharge: 2010-04-26 | Payer: Self-pay | Admitting: Obstetrics and Gynecology

## 2010-07-08 ENCOUNTER — Encounter
Admission: RE | Admit: 2010-07-08 | Discharge: 2010-07-08 | Payer: Self-pay | Source: Home / Self Care | Attending: Obstetrics and Gynecology | Admitting: Obstetrics and Gynecology

## 2010-07-08 IMAGING — MG MM SCREEN MAMMOGRAM BILATERAL
4 series · 4 of 4 positions shown · non-contrast
Comparison: none

DG SCREEN MAMMOGRAM BILATERAL
Bilateral CC and MLO view(s) were taken.

DIGITAL SCREENING MAMMOGRAM WITH CAD:
The breast tissue is heterogeneously dense.  No masses or malignant type calcifications are 
identified.  Compared with prior studies.
Images were processed with CAD.

[R CC]
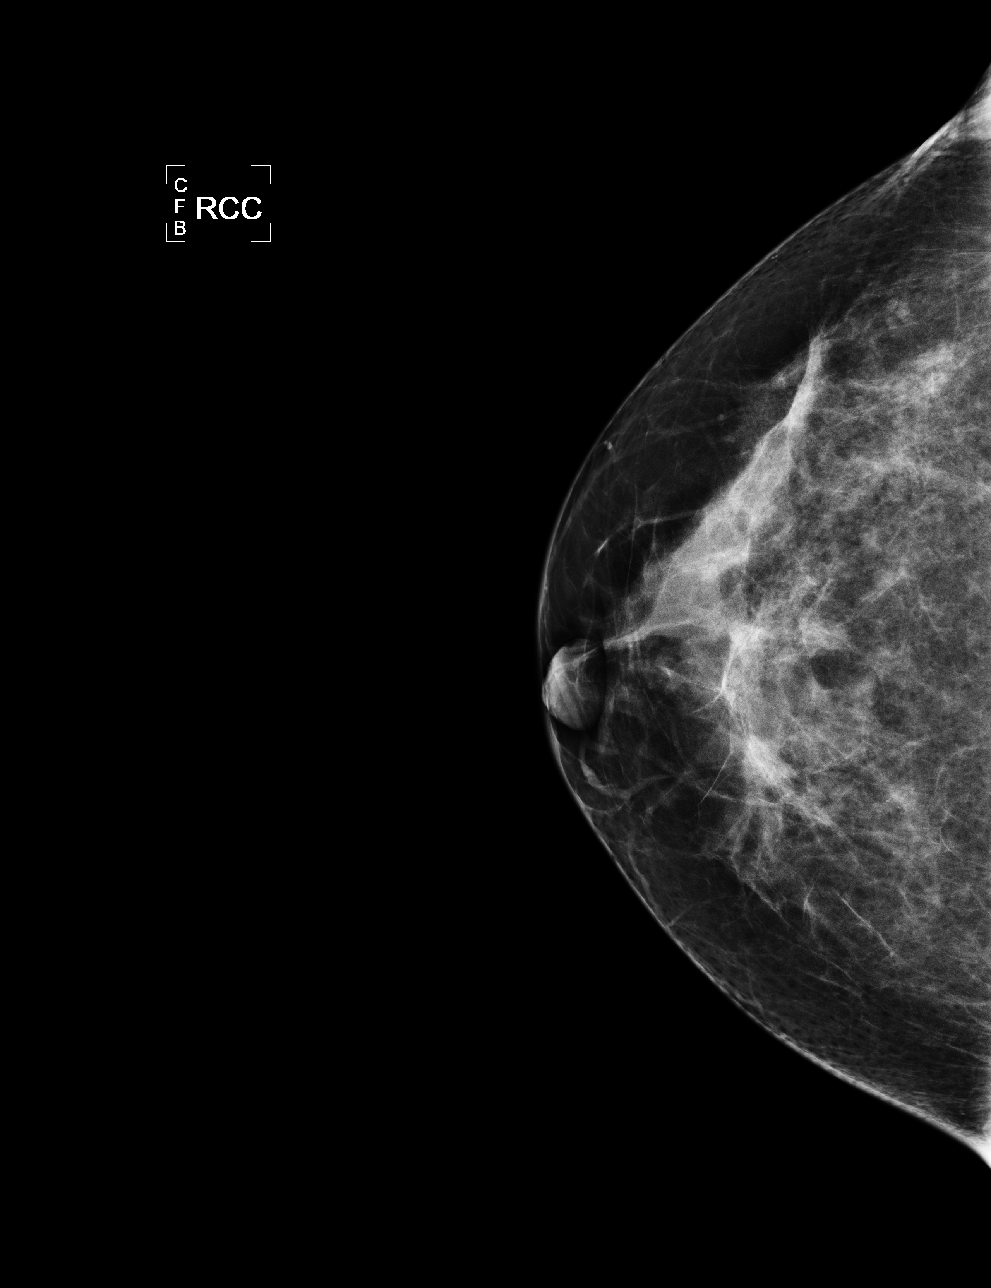

[L CC]
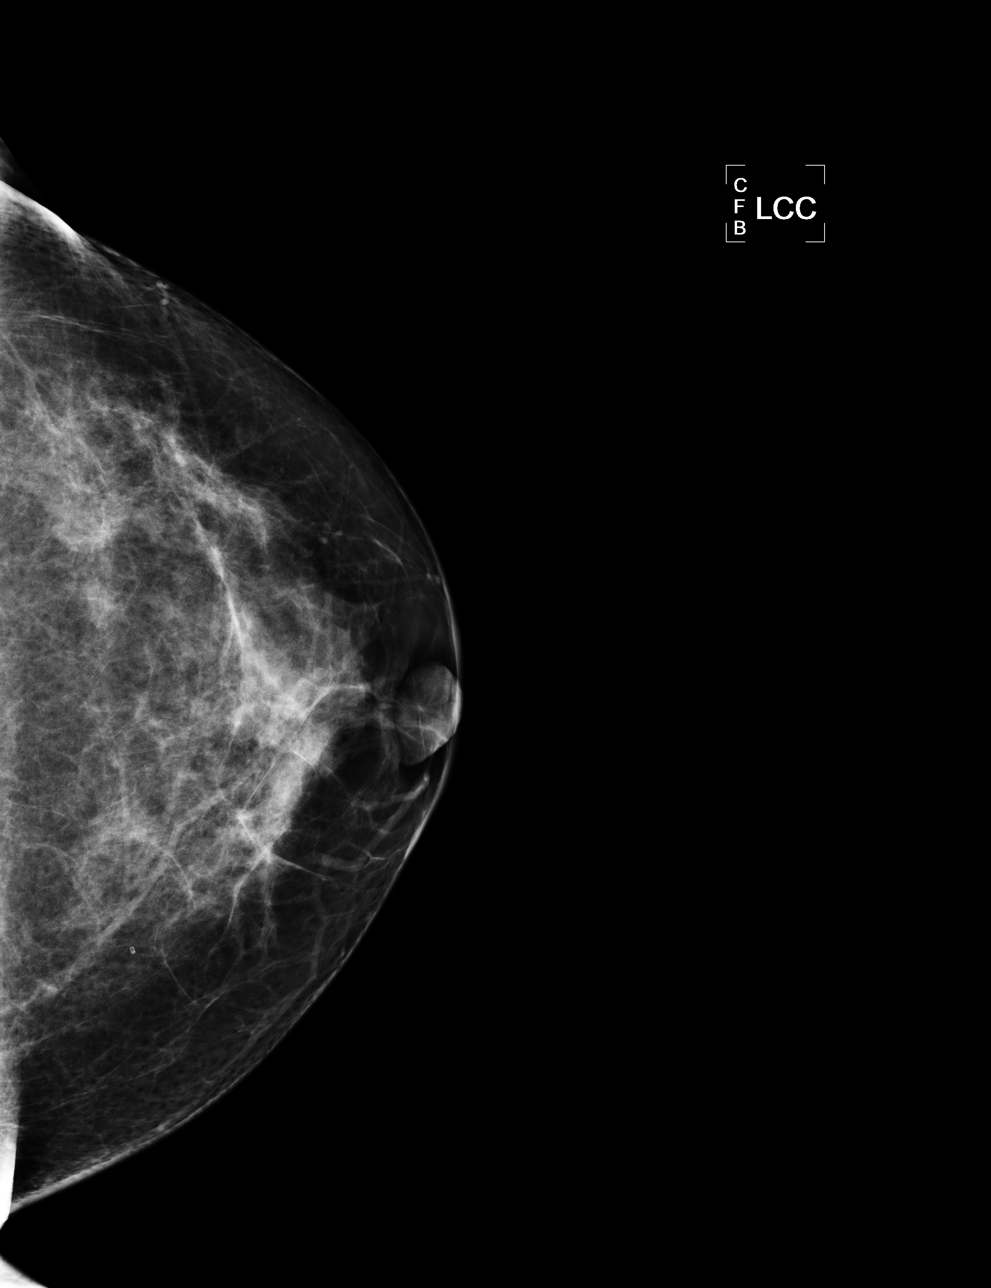

[L MLO]
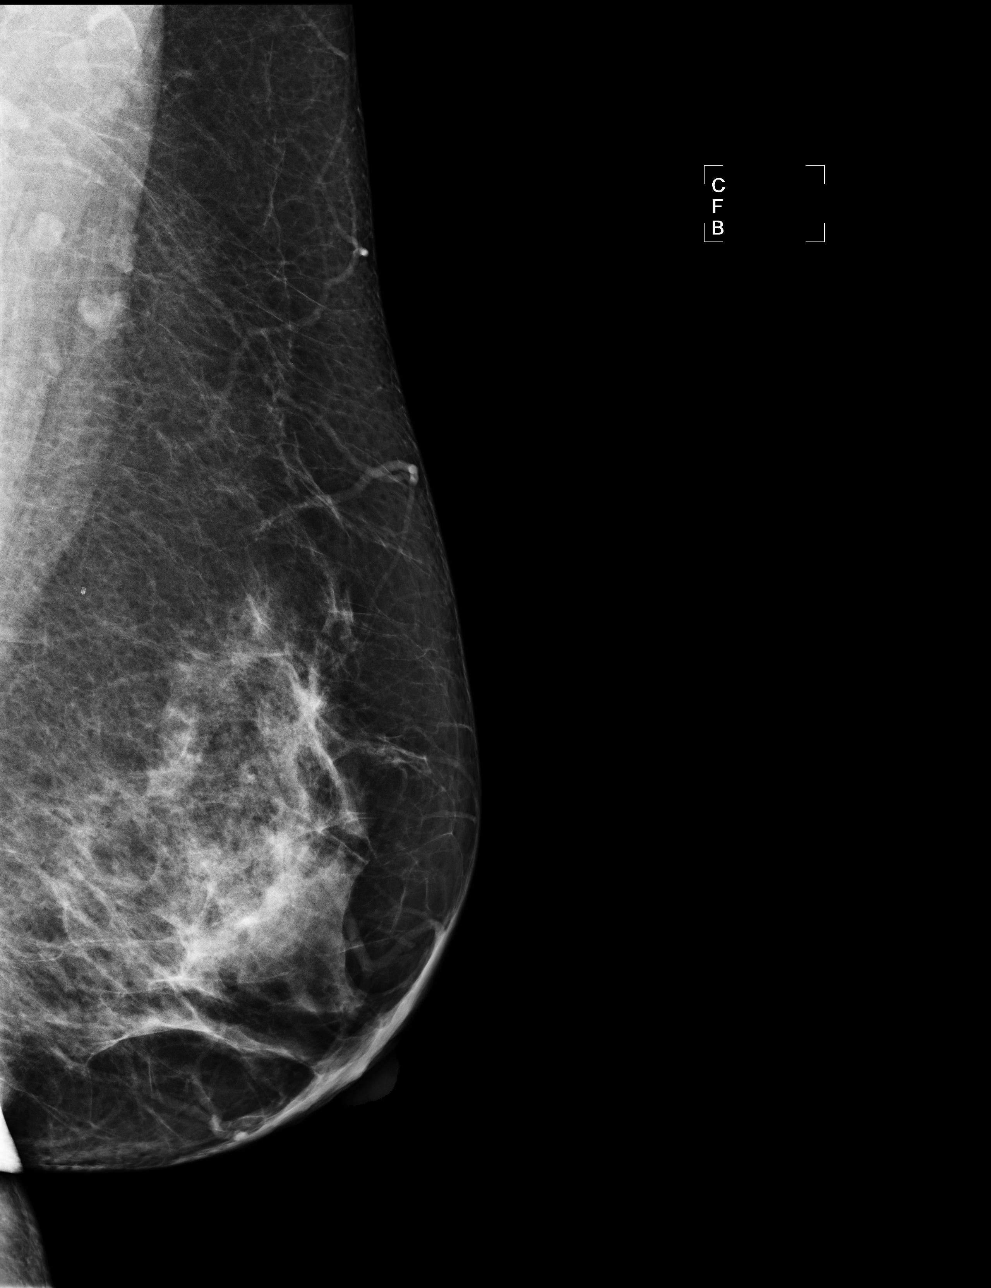

[R MLO]
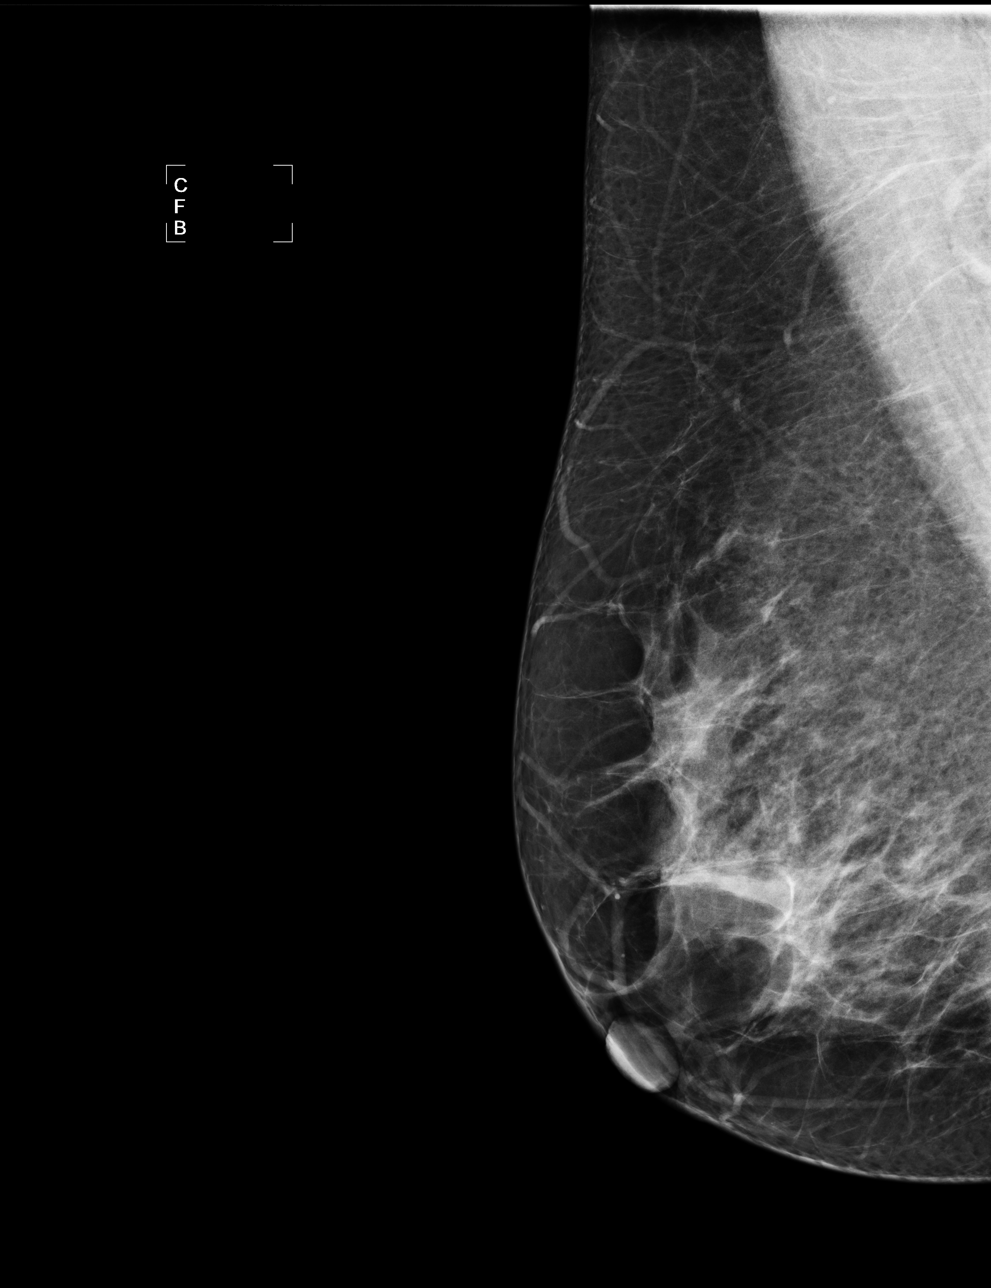

[4 of 4 positions shown; findings below may reference images not displayed]

IMPRESSION: No specific mammographic evidence of malignancy.  Next screening mammogram is recommended in one 
year.

A result letter of this screening mammogram will be mailed directly to the patient.

ASSESSMENT: Negative - BI-RADS 1

Screening mammogram in 1 year.
,

## 2010-11-16 NOTE — Op Note (Signed)
Victoria Mooney, Victoria Mooney        ACCOUNT NO.:  [id number]   MEDICAL RECORD NO.:  [id number]          PATIENT TYPE:  AMB   LOCATION:  ENDO                         FACILITY:  [facility]   PHYSICIAN:  Petra Kuba, M.D.    DATE OF BIRTH:  1956-04-11   DATE OF PROCEDURE:  01/09/2007  DATE OF DISCHARGE:                               OPERATIVE REPORT   PROCEDURE:  Colonoscopy and polypectomy.   INDICATIONS:  Family history of colon cancer in grandfather, polyps in  her mother.  Consent was signed after risks, benefits, methods, and  options were thoroughly discussed in the office on multiple occasions  and with my nurse prior to any sedation.   MEDICINES USED:  Fentanyl 75 mcg, Versed 5 mg.   DESCRIPTION OF PROCEDURE:  Rectal inspection is pertinent for external  hemorrhoids, small. Digital exam was negative.  Pediatric video  colonoscope was inserted and easily advanced to the proximal end of the  splenic flexure. At that point, there was some looping, and rolling her  on her back and with abdominal pressure, easily able to advance to the  cecum. No obvious abnormality was seen on insertion.  The cecum was  identified by the appendiceal orifice and ileocecal valve.  The scope  was slowly withdrawn.  The prep was adequate.  There was some liquid  stool that required washing and suctioning. On slow withdrawal through  the colon, the cecum, ascending, transverse and descending were normal.  In the mid sigmoid, two small semipedunculated polyps were seen, snared,  electrocautery applied.  Both were suctioned through the scope and  collected in the trap and put in the same container.  The scope was  further withdrawn.  No additional findings were seen as we slowly  withdrew back to the rectum.  Once in the rectum, anorectal pull-through  and retroflexion confirmed some small hemorrhoids.  The scope was  straightened and readvanced a short ways up the left side of the colon,  the air was  suctioned, the scope removed.  The patient tolerated the  procedure well.  There was no obvious immediate complication.   ENDOSCOPIC DIAGNOSES:  1. Internal and external small hemorrhoids.  2. Two small midsigmoid polyps, snared.  3. Otherwise, within normal limits to the cecum.   PLAN:  Await pathology.  Probably recheck colon screening in five years.  Happy to see back p.r.n., otherwise, return care to Dr. Chestine Spore, customary  care, screening and maintenance.           ______________________________  Petra Kuba, M.D.     MEM/MEDQ  D:  01/09/2007  T:  01/09/2007  Job:  [id number]

## 2011-08-10 ENCOUNTER — Other Ambulatory Visit: Payer: Self-pay | Admitting: Family Medicine

## 2011-08-10 DIAGNOSIS — Z1231 Encounter for screening mammogram for malignant neoplasm of breast: Secondary | ICD-10-CM

## 2011-08-24 ENCOUNTER — Ambulatory Visit
Admission: RE | Admit: 2011-08-24 | Discharge: 2011-08-24 | Disposition: A | Discharge status: Home / Self Care | Payer: 59 | Source: Ambulatory Visit | Attending: Family Medicine | Admitting: Family Medicine

## 2011-08-24 DIAGNOSIS — Z1231 Encounter for screening mammogram for malignant neoplasm of breast: Secondary | ICD-10-CM

## 2011-08-24 IMAGING — MG MM DIGITAL SCREENING BILAT
4 series · 4 of 4 positions shown · non-contrast
Comparison: none

DG SCREEN MAMMOGRAM BILATERAL
Bilateral CC and MLO view(s) were taken.

DIGITAL SCREENING MAMMOGRAM WITH CAD:
The breast tissue is heterogeneously dense.  No masses or malignant type calcifications are 
identified.  Compared with prior studies.
Images were processed with CAD.

[R CC]
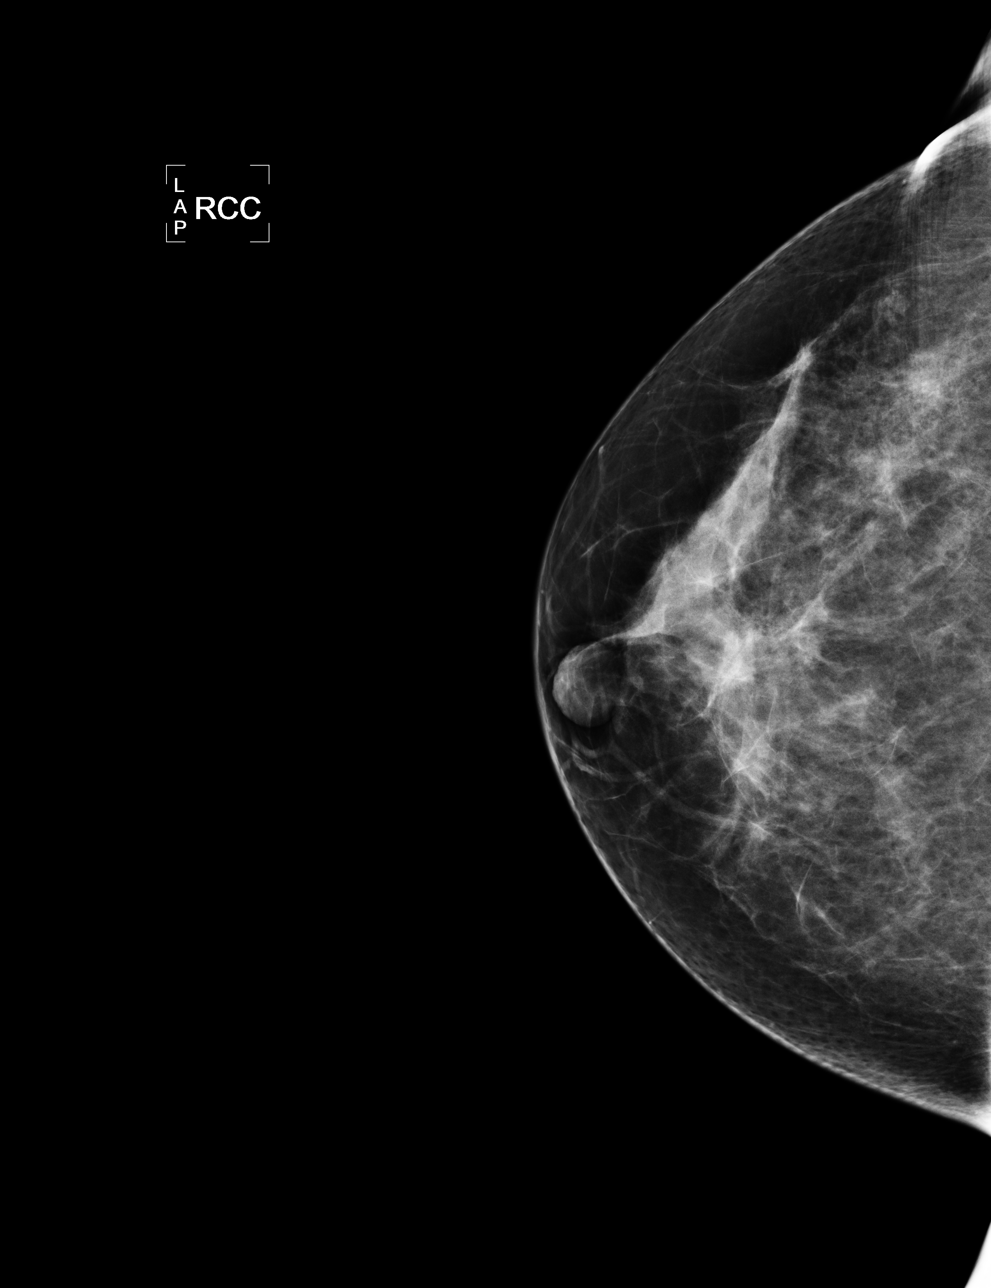

[L CC]
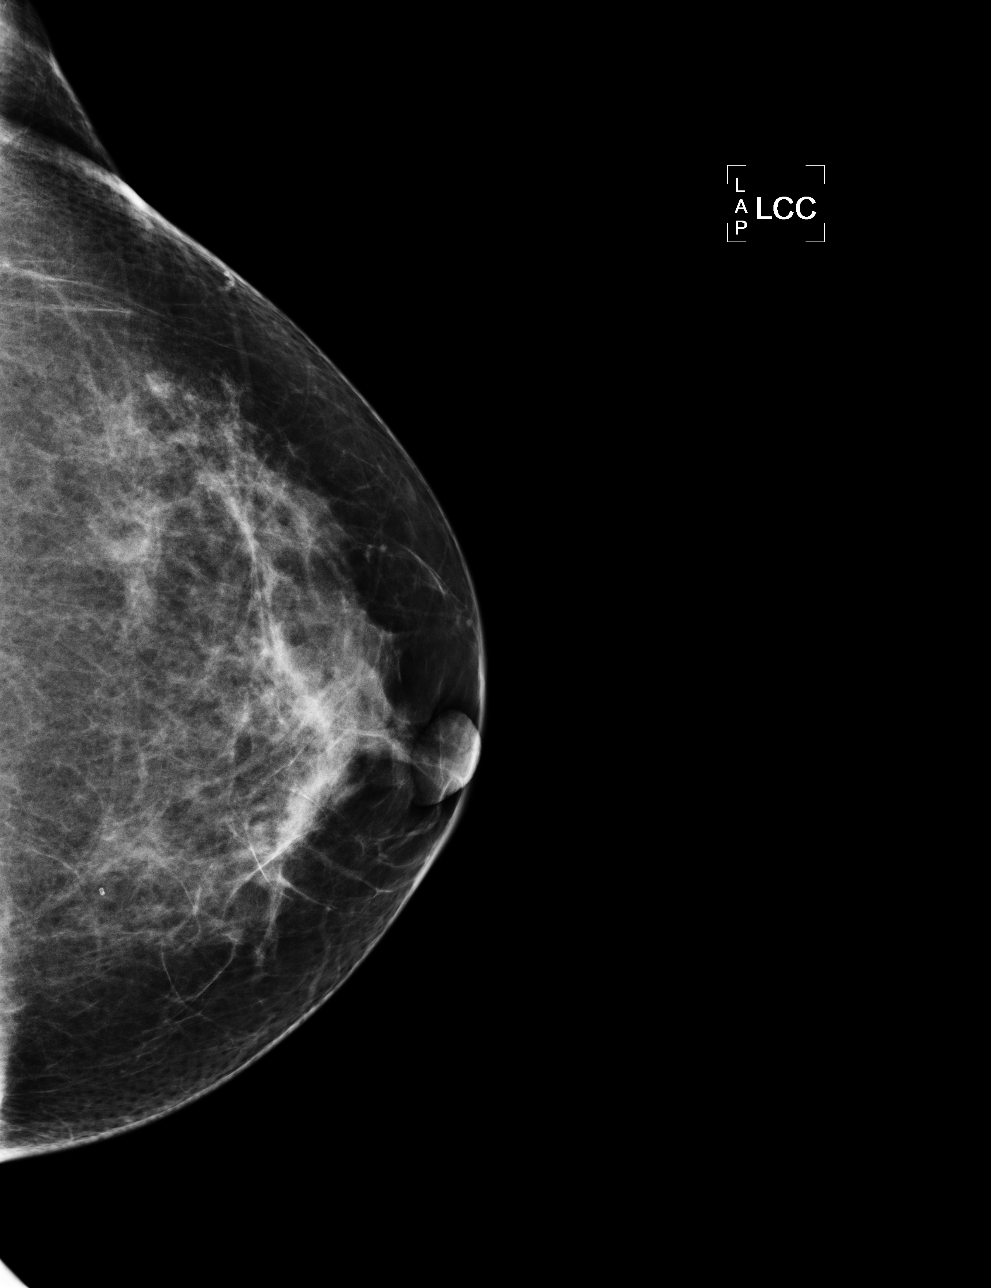

[L MLO]
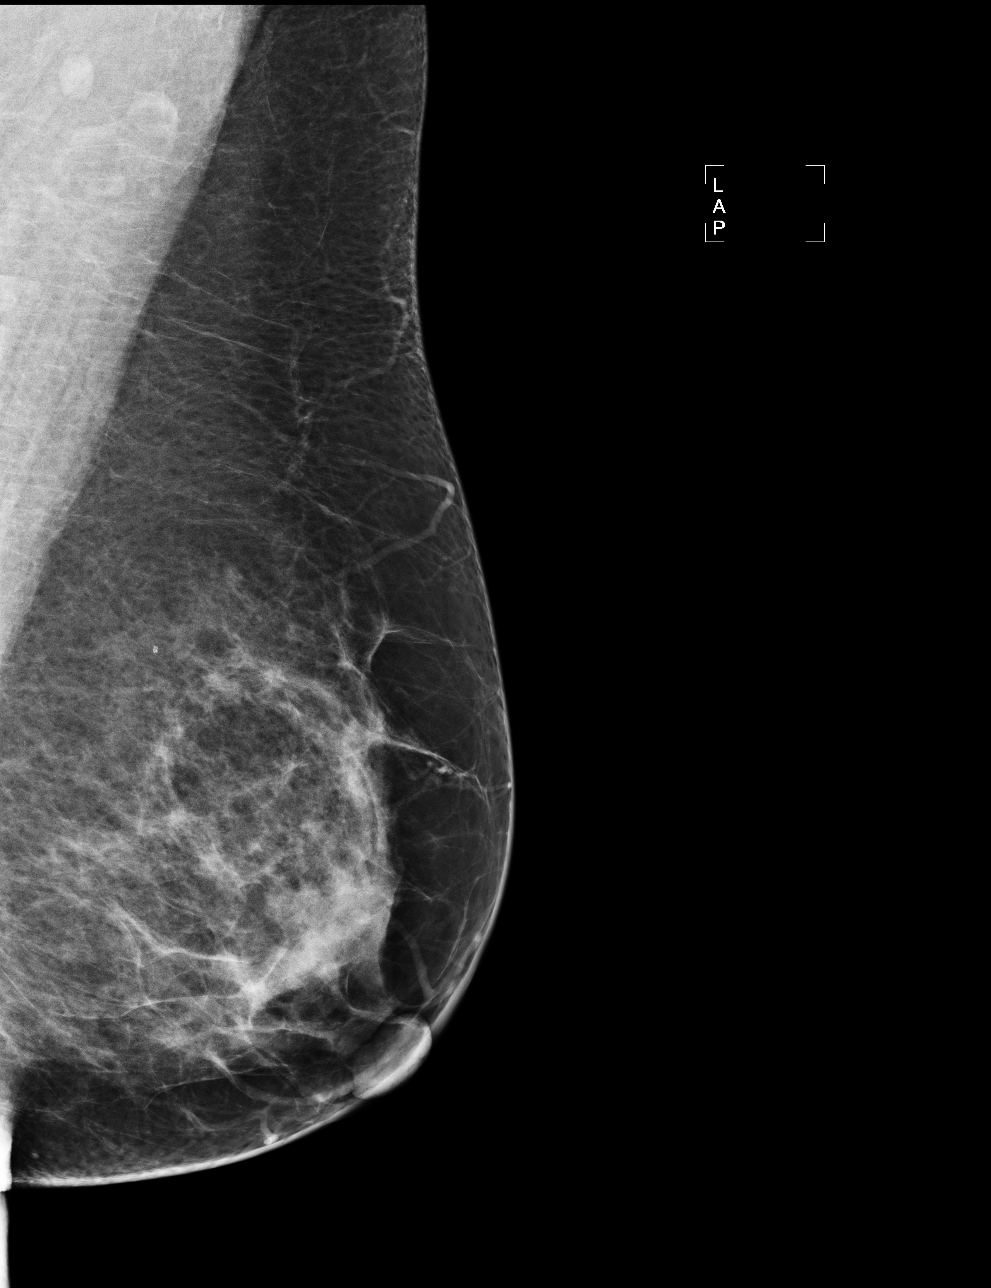

[R MLO]
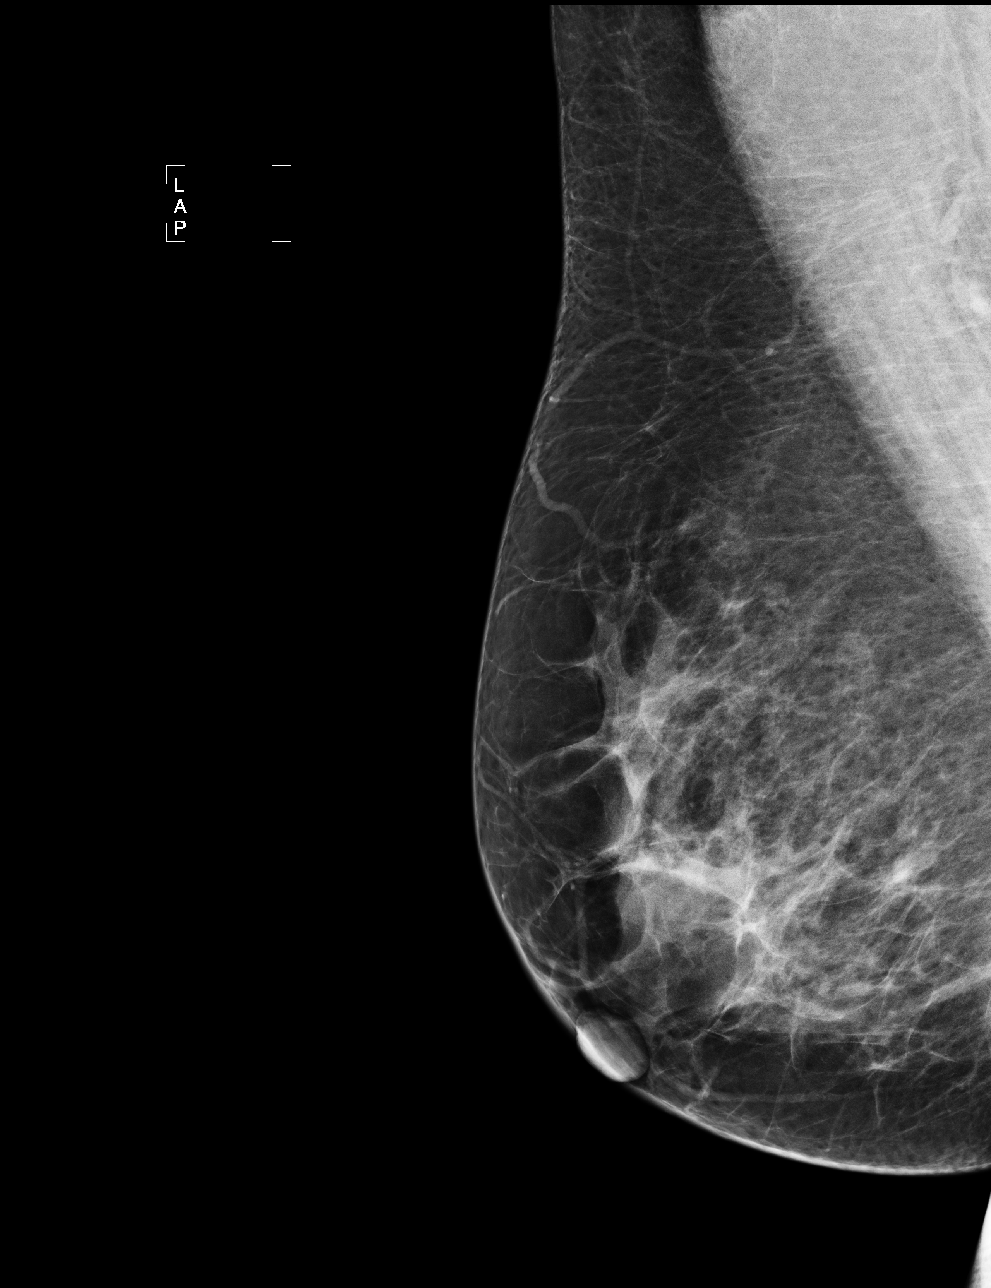

[4 of 4 positions shown; findings below may reference images not displayed]

IMPRESSION: No specific mammographic evidence of malignancy.  Next screening mammogram is recommended in one 
year.

A result letter of this screening mammogram will be mailed directly to the patient.

ASSESSMENT: Negative - BI-RADS 1

Screening mammogram in 1 year.
,

## 2012-08-18 ENCOUNTER — Other Ambulatory Visit: Payer: Self-pay

## 2012-09-10 ENCOUNTER — Other Ambulatory Visit: Payer: Self-pay | Admitting: Gastroenterology

## 2012-09-25 ENCOUNTER — Other Ambulatory Visit: Payer: Self-pay

## 2012-09-25 DIAGNOSIS — Z1231 Encounter for screening mammogram for malignant neoplasm of breast: Secondary | ICD-10-CM

## 2012-10-22 ENCOUNTER — Ambulatory Visit
Admission: RE | Admit: 2012-10-22 | Discharge: 2012-10-22 | Disposition: A | Discharge status: Home / Self Care | Payer: 59 | Source: Ambulatory Visit

## 2012-10-22 DIAGNOSIS — Z1231 Encounter for screening mammogram for malignant neoplasm of breast: Secondary | ICD-10-CM

## 2013-01-28 ENCOUNTER — Ambulatory Visit: Payer: 59 | Admitting: Sports Medicine

## 2013-05-09 ENCOUNTER — Other Ambulatory Visit: Payer: Self-pay

## 2013-08-12 ENCOUNTER — Other Ambulatory Visit: Payer: Self-pay | Admitting: Family Medicine

## 2013-08-12 ENCOUNTER — Other Ambulatory Visit (HOSPITAL_COMMUNITY)
Admission: RE | Admit: 2013-08-12 | Discharge: 2013-08-12 | Disposition: A | Discharge status: Home / Self Care | Payer: 59 | Source: Ambulatory Visit | Attending: Family Medicine | Admitting: Family Medicine

## 2013-08-12 DIAGNOSIS — Z124 Encounter for screening for malignant neoplasm of cervix: Secondary | ICD-10-CM | POA: Insufficient documentation

## 2013-08-12 DIAGNOSIS — Z1151 Encounter for screening for human papillomavirus (HPV): Secondary | ICD-10-CM | POA: Insufficient documentation

## 2014-02-18 ENCOUNTER — Other Ambulatory Visit: Payer: Self-pay

## 2014-02-18 DIAGNOSIS — Z1231 Encounter for screening mammogram for malignant neoplasm of breast: Secondary | ICD-10-CM

## 2014-02-26 ENCOUNTER — Ambulatory Visit
Admission: RE | Admit: 2014-02-26 | Discharge: 2014-02-26 | Disposition: A | Discharge status: Home / Self Care | Payer: 59 | Source: Ambulatory Visit

## 2014-02-26 DIAGNOSIS — Z1231 Encounter for screening mammogram for malignant neoplasm of breast: Secondary | ICD-10-CM

## 2015-03-31 ENCOUNTER — Other Ambulatory Visit: Payer: Self-pay

## 2015-03-31 DIAGNOSIS — Z1231 Encounter for screening mammogram for malignant neoplasm of breast: Secondary | ICD-10-CM

## 2015-04-24 ENCOUNTER — Ambulatory Visit
Admission: RE | Admit: 2015-04-24 | Discharge: 2015-04-24 | Disposition: A | Discharge status: Home / Self Care | Payer: 59 | Source: Ambulatory Visit

## 2015-04-24 DIAGNOSIS — Z1231 Encounter for screening mammogram for malignant neoplasm of breast: Secondary | ICD-10-CM

## 2015-08-17 DIAGNOSIS — E663 Overweight: Secondary | ICD-10-CM | POA: Diagnosis not present

## 2015-08-17 DIAGNOSIS — Z7984 Long term (current) use of oral hypoglycemic drugs: Secondary | ICD-10-CM | POA: Diagnosis not present

## 2015-08-17 DIAGNOSIS — I10 Essential (primary) hypertension: Secondary | ICD-10-CM | POA: Diagnosis not present

## 2015-08-17 DIAGNOSIS — E119 Type 2 diabetes mellitus without complications: Secondary | ICD-10-CM | POA: Diagnosis not present

## 2015-08-17 DIAGNOSIS — R454 Irritability and anger: Secondary | ICD-10-CM | POA: Diagnosis not present

## 2015-08-17 DIAGNOSIS — J309 Allergic rhinitis, unspecified: Secondary | ICD-10-CM | POA: Diagnosis not present

## 2015-08-17 DIAGNOSIS — E782 Mixed hyperlipidemia: Secondary | ICD-10-CM | POA: Diagnosis not present

## 2015-08-17 DIAGNOSIS — Z0000 Encounter for general adult medical examination without abnormal findings: Secondary | ICD-10-CM | POA: Diagnosis not present

## 2015-08-17 DIAGNOSIS — G479 Sleep disorder, unspecified: Secondary | ICD-10-CM | POA: Diagnosis not present

## 2015-09-29 ENCOUNTER — Encounter: Payer: Self-pay | Admitting: Dietician

## 2015-09-29 ENCOUNTER — Encounter: Payer: 59 | Attending: Family Medicine | Admitting: Dietician

## 2015-09-29 VITALS — Ht 60.0 in | Wt 138.8 lb

## 2015-09-29 DIAGNOSIS — Z713 Dietary counseling and surveillance: Secondary | ICD-10-CM | POA: Insufficient documentation

## 2015-09-29 DIAGNOSIS — E1110 Type 2 diabetes mellitus with ketoacidosis without coma: Secondary | ICD-10-CM

## 2015-09-29 NOTE — Progress Notes (Signed)
Diabetes Self-Management Education  Visit Type: First/Initial  Appt. Start Time: 730 Appt. End Time: 830  09/29/2015  Ms. Victoria Mooney, identified by name and date of birth, is a 60 y.o. female with a diagnosis of Diabetes: Type 2.   ASSESSMENT  Height 5' (1.524 m), weight 138 lb 12.8 oz (62.959 kg). Body mass index is 27.11 kg/(m^2).      Diabetes Self-Management Education - 09/29/15 0848    Patient Education   Disease state  Definition of diabetes, type 1 and 2, and the diagnosis of diabetes   Nutrition management  Role of diet in the treatment of diabetes and the relationship between the three main macronutrients and blood glucose level;Reviewed blood glucose goals for pre and post meals and how to evaluate the patients' food intake on their blood glucose level.   Physical activity and exercise  Role of exercise on diabetes management, blood pressure control and cardiac health.   Monitoring Purpose and frequency of SMBG.;Interpreting lab values - A1C, lipid, urine microalbumina.   Acute complications Taught treatment of hypoglycemia - the 15 rule.   Psychosocial adjustment Role of stress on diabetes   Individualized Goals (developed by patient)   Nutrition Follow meal plan discussed   Physical Activity Exercise 3-5 times per week   Medications take my medication as prescribed   Monitoring  test my blood glucose as discussed   Reducing Risk examine blood glucose patterns   Outcomes   Expected Outcomes Demonstrated interest in learning. Expect positive outcomes   Future DMSE PRN   Program Status Completed      Individualized Plan for Diabetes Self-Management Training:   Learning Objective:  Patient will have a greater understanding of diabetes self-management. Patient education plan is to attend individual and/or group sessions per assessed needs and concerns.   Plan:   There are no Patient Instructions on file for this visit.  Expected Outcomes:  Demonstrated  interest in learning. Expect positive outcomes  Education material provided: Living Well with Diabetes and Meal plan card  If problems or questions, patient to contact team via:  Phone  Future DSME appointment: PRN

## 2015-10-08 DIAGNOSIS — H524 Presbyopia: Secondary | ICD-10-CM | POA: Diagnosis not present

## 2016-02-15 DIAGNOSIS — Z6826 Body mass index (BMI) 26.0-26.9, adult: Secondary | ICD-10-CM | POA: Diagnosis not present

## 2016-02-15 DIAGNOSIS — F325 Major depressive disorder, single episode, in full remission: Secondary | ICD-10-CM | POA: Diagnosis not present

## 2016-02-15 DIAGNOSIS — J309 Allergic rhinitis, unspecified: Secondary | ICD-10-CM | POA: Diagnosis not present

## 2016-02-15 DIAGNOSIS — E119 Type 2 diabetes mellitus without complications: Secondary | ICD-10-CM | POA: Diagnosis not present

## 2016-02-15 DIAGNOSIS — I10 Essential (primary) hypertension: Secondary | ICD-10-CM | POA: Diagnosis not present

## 2016-02-15 DIAGNOSIS — G479 Sleep disorder, unspecified: Secondary | ICD-10-CM | POA: Diagnosis not present

## 2016-02-15 DIAGNOSIS — Z1159 Encounter for screening for other viral diseases: Secondary | ICD-10-CM | POA: Diagnosis not present

## 2016-02-15 DIAGNOSIS — E663 Overweight: Secondary | ICD-10-CM | POA: Diagnosis not present

## 2016-02-15 DIAGNOSIS — E782 Mixed hyperlipidemia: Secondary | ICD-10-CM | POA: Diagnosis not present

## 2016-04-26 ENCOUNTER — Other Ambulatory Visit: Payer: Self-pay | Admitting: Family Medicine

## 2016-04-26 DIAGNOSIS — Z1231 Encounter for screening mammogram for malignant neoplasm of breast: Secondary | ICD-10-CM

## 2016-05-19 ENCOUNTER — Ambulatory Visit
Admission: RE | Admit: 2016-05-19 | Discharge: 2016-05-19 | Disposition: A | Discharge status: Home / Self Care | Payer: 59 | Source: Ambulatory Visit | Attending: Family Medicine | Admitting: Family Medicine

## 2016-05-19 DIAGNOSIS — Z1231 Encounter for screening mammogram for malignant neoplasm of breast: Secondary | ICD-10-CM | POA: Diagnosis not present

## 2016-05-25 DIAGNOSIS — Z7984 Long term (current) use of oral hypoglycemic drugs: Secondary | ICD-10-CM | POA: Diagnosis not present

## 2016-05-25 DIAGNOSIS — E119 Type 2 diabetes mellitus without complications: Secondary | ICD-10-CM | POA: Diagnosis not present

## 2016-07-13 DIAGNOSIS — J0100 Acute maxillary sinusitis, unspecified: Secondary | ICD-10-CM | POA: Diagnosis not present

## 2016-09-21 DIAGNOSIS — E119 Type 2 diabetes mellitus without complications: Secondary | ICD-10-CM | POA: Diagnosis not present

## 2016-09-21 DIAGNOSIS — Z7984 Long term (current) use of oral hypoglycemic drugs: Secondary | ICD-10-CM | POA: Diagnosis not present

## 2016-09-26 DIAGNOSIS — J309 Allergic rhinitis, unspecified: Secondary | ICD-10-CM | POA: Diagnosis not present

## 2016-09-26 DIAGNOSIS — E119 Type 2 diabetes mellitus without complications: Secondary | ICD-10-CM | POA: Diagnosis not present

## 2016-09-26 DIAGNOSIS — I10 Essential (primary) hypertension: Secondary | ICD-10-CM | POA: Diagnosis not present

## 2016-09-26 DIAGNOSIS — E782 Mixed hyperlipidemia: Secondary | ICD-10-CM | POA: Diagnosis not present

## 2016-09-26 DIAGNOSIS — F325 Major depressive disorder, single episode, in full remission: Secondary | ICD-10-CM | POA: Diagnosis not present

## 2016-09-26 DIAGNOSIS — Z23 Encounter for immunization: Secondary | ICD-10-CM | POA: Diagnosis not present

## 2016-09-26 DIAGNOSIS — Z86010 Personal history of colonic polyps: Secondary | ICD-10-CM | POA: Diagnosis not present

## 2016-09-26 DIAGNOSIS — G479 Sleep disorder, unspecified: Secondary | ICD-10-CM | POA: Diagnosis not present

## 2016-09-26 DIAGNOSIS — Z0000 Encounter for general adult medical examination without abnormal findings: Secondary | ICD-10-CM | POA: Diagnosis not present

## 2016-10-13 DIAGNOSIS — H524 Presbyopia: Secondary | ICD-10-CM | POA: Diagnosis not present

## 2016-10-13 DIAGNOSIS — H2513 Age-related nuclear cataract, bilateral: Secondary | ICD-10-CM | POA: Diagnosis not present

## 2016-10-13 DIAGNOSIS — E119 Type 2 diabetes mellitus without complications: Secondary | ICD-10-CM | POA: Diagnosis not present

## 2017-03-28 DIAGNOSIS — I10 Essential (primary) hypertension: Secondary | ICD-10-CM | POA: Diagnosis not present

## 2017-03-28 DIAGNOSIS — E7800 Pure hypercholesterolemia, unspecified: Secondary | ICD-10-CM | POA: Diagnosis not present

## 2017-03-28 DIAGNOSIS — E119 Type 2 diabetes mellitus without complications: Secondary | ICD-10-CM | POA: Diagnosis not present

## 2017-03-28 DIAGNOSIS — Z7984 Long term (current) use of oral hypoglycemic drugs: Secondary | ICD-10-CM | POA: Diagnosis not present

## 2017-03-29 DIAGNOSIS — Z6828 Body mass index (BMI) 28.0-28.9, adult: Secondary | ICD-10-CM | POA: Diagnosis not present

## 2017-03-29 DIAGNOSIS — F325 Major depressive disorder, single episode, in full remission: Secondary | ICD-10-CM | POA: Diagnosis not present

## 2017-03-29 DIAGNOSIS — E663 Overweight: Secondary | ICD-10-CM | POA: Diagnosis not present

## 2017-03-29 DIAGNOSIS — E119 Type 2 diabetes mellitus without complications: Secondary | ICD-10-CM | POA: Diagnosis not present

## 2017-03-29 DIAGNOSIS — I10 Essential (primary) hypertension: Secondary | ICD-10-CM | POA: Diagnosis not present

## 2017-03-29 DIAGNOSIS — E782 Mixed hyperlipidemia: Secondary | ICD-10-CM | POA: Diagnosis not present

## 2017-03-29 DIAGNOSIS — Z7984 Long term (current) use of oral hypoglycemic drugs: Secondary | ICD-10-CM | POA: Diagnosis not present

## 2017-03-29 DIAGNOSIS — G479 Sleep disorder, unspecified: Secondary | ICD-10-CM | POA: Diagnosis not present

## 2017-05-01 ENCOUNTER — Other Ambulatory Visit: Payer: Self-pay | Admitting: Family Medicine

## 2017-05-01 DIAGNOSIS — Z1231 Encounter for screening mammogram for malignant neoplasm of breast: Secondary | ICD-10-CM

## 2017-05-22 DIAGNOSIS — Z86010 Personal history of colonic polyps: Secondary | ICD-10-CM | POA: Diagnosis not present

## 2017-05-22 DIAGNOSIS — R198 Other specified symptoms and signs involving the digestive system and abdomen: Secondary | ICD-10-CM | POA: Diagnosis not present

## 2017-06-01 ENCOUNTER — Ambulatory Visit
Admission: RE | Admit: 2017-06-01 | Discharge: 2017-06-01 | Disposition: A | Discharge status: Home / Self Care | Payer: 59 | Source: Ambulatory Visit | Attending: Family Medicine | Admitting: Family Medicine

## 2017-06-01 DIAGNOSIS — Z1231 Encounter for screening mammogram for malignant neoplasm of breast: Secondary | ICD-10-CM | POA: Diagnosis not present

## 2017-06-01 IMAGING — MG 2D DIGITAL SCREENING BILATERAL MAMMOGRAM WITH CAD AND ADJUNCT TO
9 of 12 series · 9 of 28 positions shown · non-contrast
Comparison: Previous exam(s).

CLINICAL DATA: Screening.

EXAM:
2D DIGITAL SCREENING BILATERAL MAMMOGRAM WITH CAD AND ADJUNCT TOMO

[R MLO]
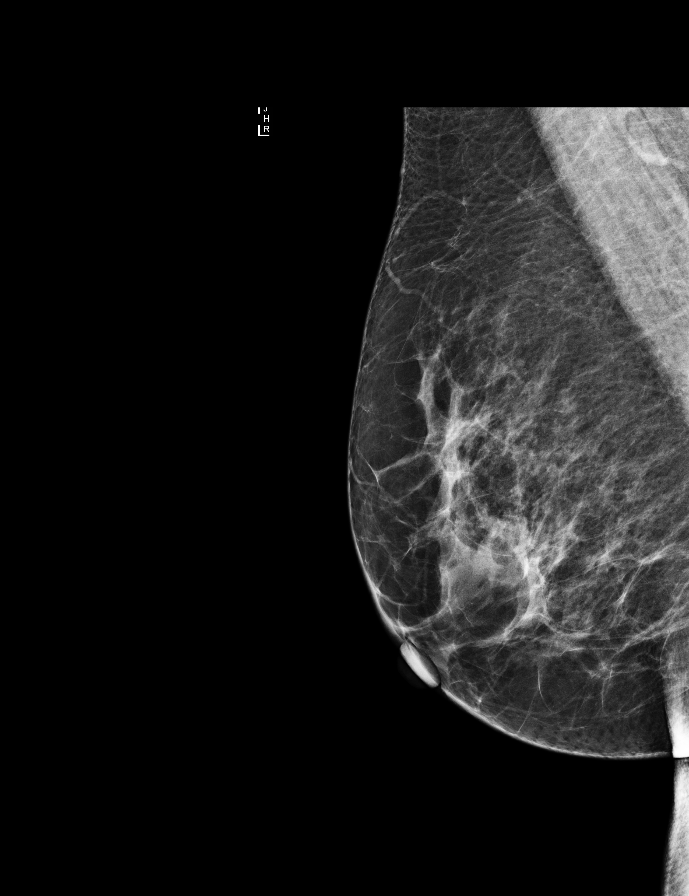

[R CC synth-2D]
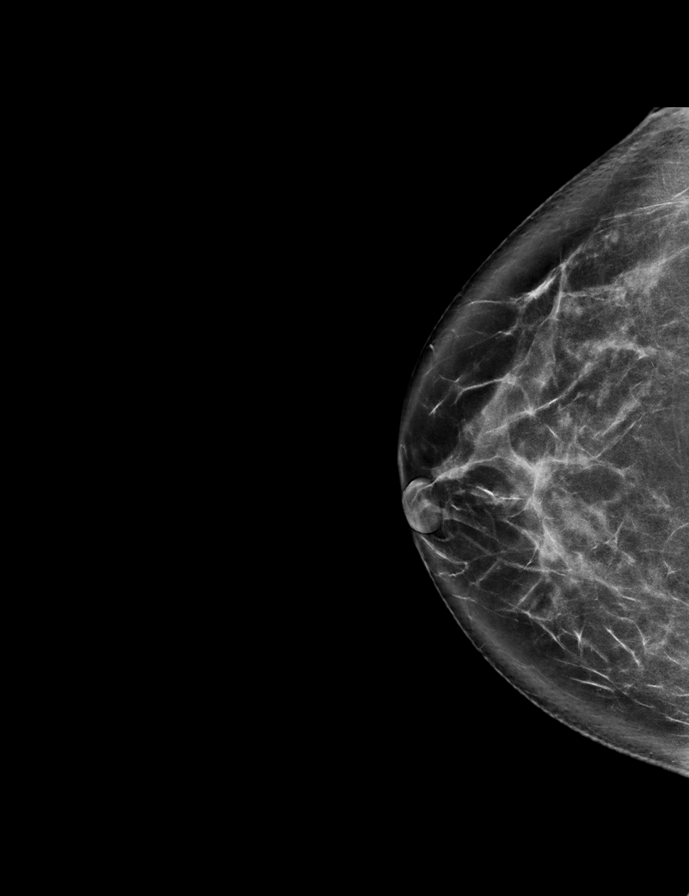

[L CC synth-2D]
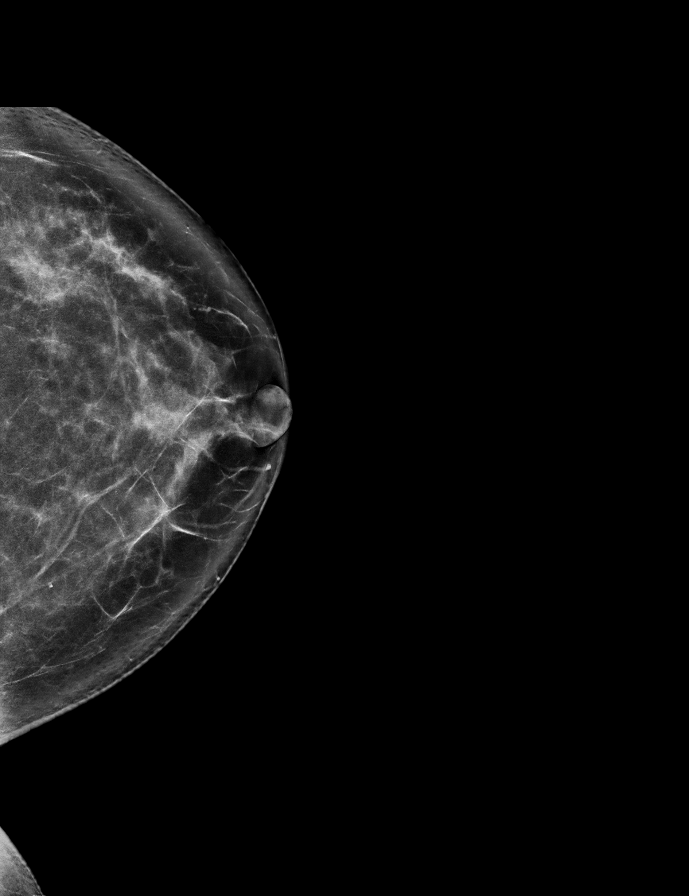

[L MLO synth-2D]
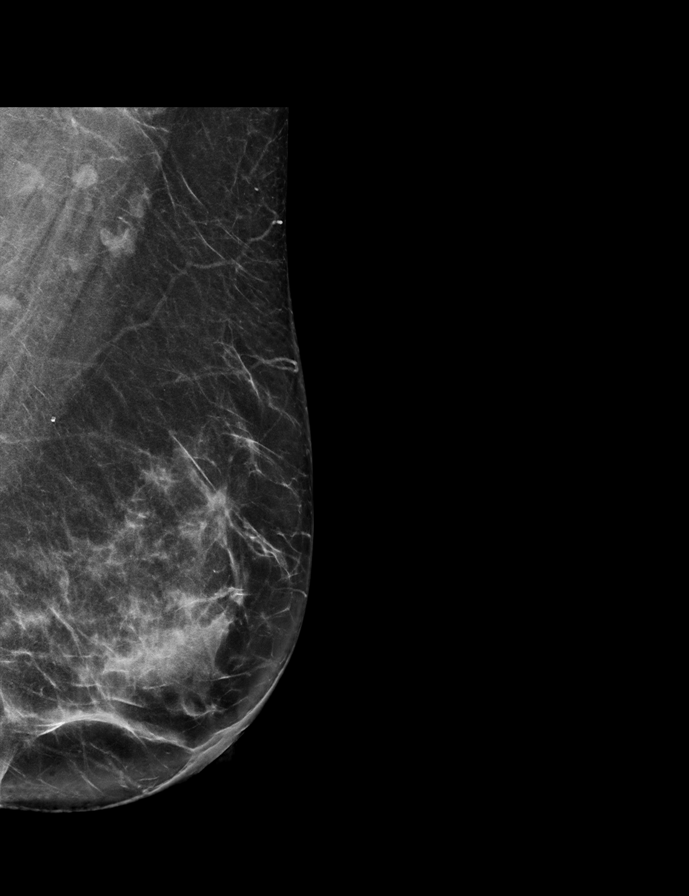

[R CC]
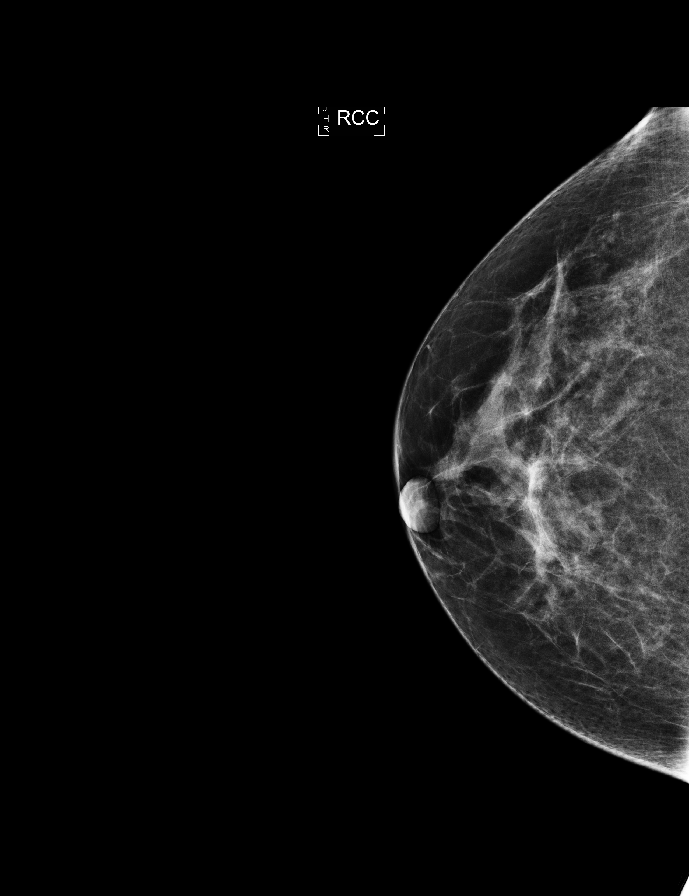

[L CC]
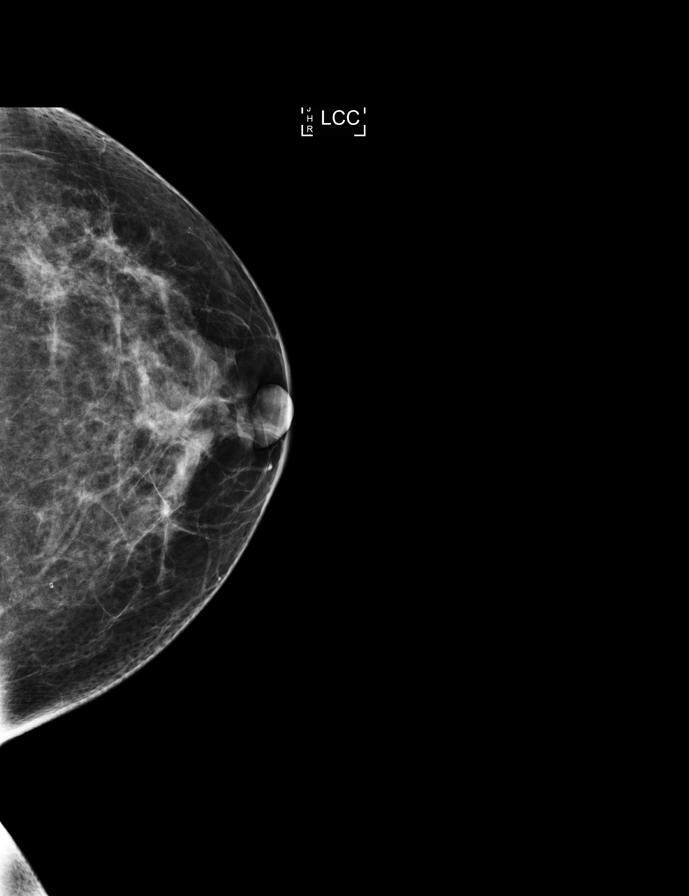

[L MLO]
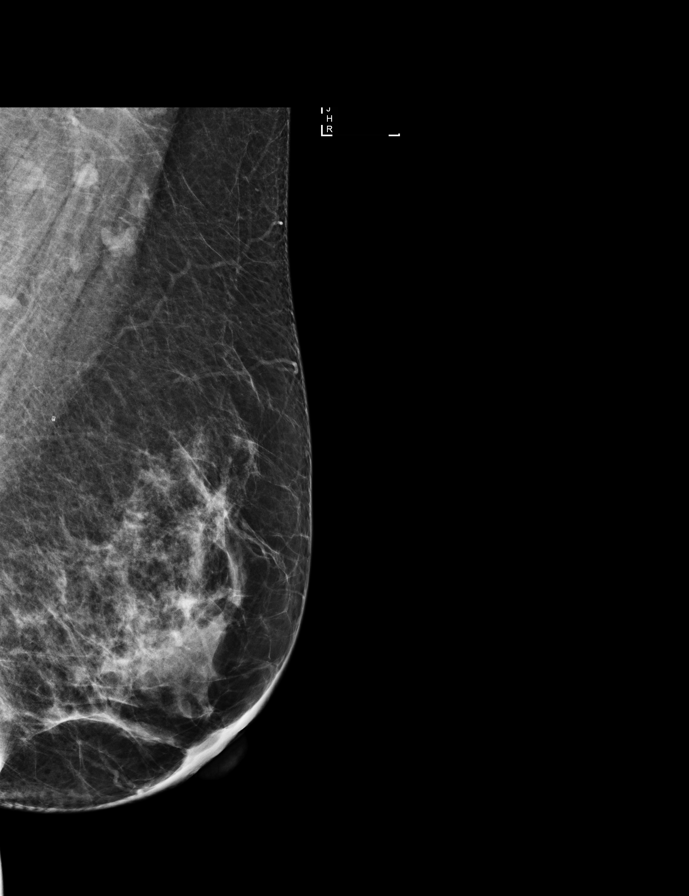

[R MLO synth-2D]
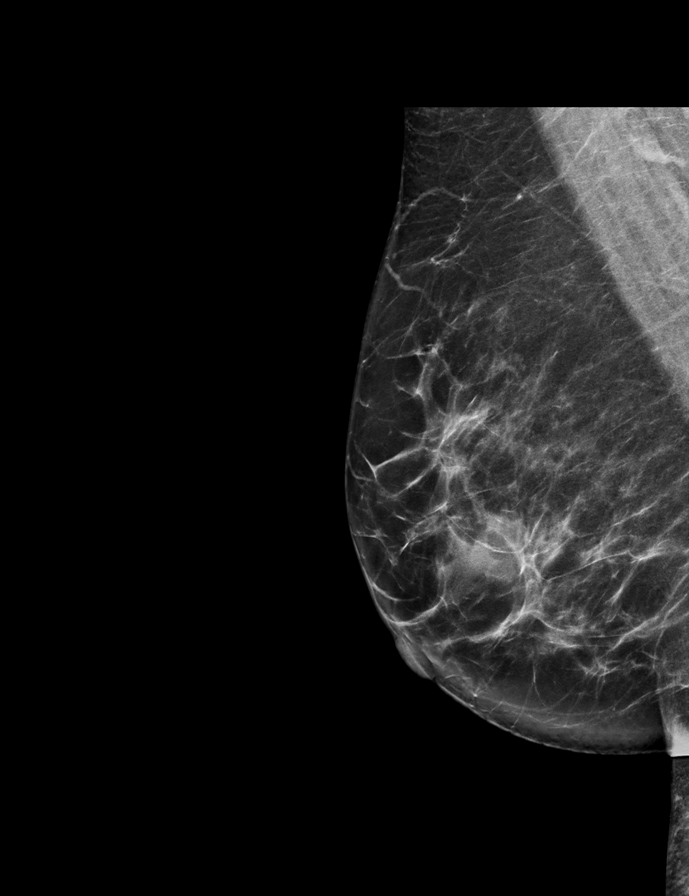

[R MLO tomo · tomo slice 39/77.0]
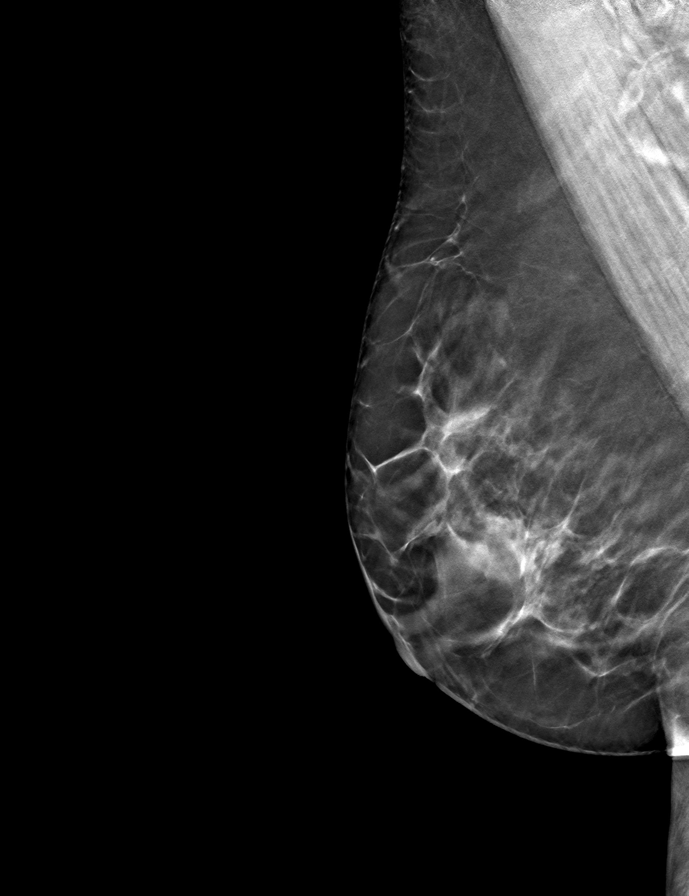

[9 of 28 positions shown; findings below may reference images not displayed]

ACR Breast Density Category b: There are scattered areas of
fibroglandular density.
FINDINGS: There are no findings suspicious for malignancy. Images were
processed with CAD.
IMPRESSION: No mammographic evidence of malignancy. A result letter of this
screening mammogram will be mailed directly to the patient.

RECOMMENDATION:
Screening mammogram in one year. (Code:[33])

BI-RADS CATEGORY  1: Negative.

## 2017-06-14 DIAGNOSIS — Z86010 Personal history of colonic polyps: Secondary | ICD-10-CM | POA: Diagnosis not present

## 2017-06-14 DIAGNOSIS — K635 Polyp of colon: Secondary | ICD-10-CM | POA: Diagnosis not present

## 2017-06-14 DIAGNOSIS — D126 Benign neoplasm of colon, unspecified: Secondary | ICD-10-CM | POA: Diagnosis not present

## 2017-10-09 DIAGNOSIS — I10 Essential (primary) hypertension: Secondary | ICD-10-CM | POA: Diagnosis not present

## 2017-10-09 DIAGNOSIS — Z86010 Personal history of colonic polyps: Secondary | ICD-10-CM | POA: Diagnosis not present

## 2017-10-09 DIAGNOSIS — E782 Mixed hyperlipidemia: Secondary | ICD-10-CM | POA: Diagnosis not present

## 2017-10-09 DIAGNOSIS — F325 Major depressive disorder, single episode, in full remission: Secondary | ICD-10-CM | POA: Diagnosis not present

## 2017-10-09 DIAGNOSIS — E119 Type 2 diabetes mellitus without complications: Secondary | ICD-10-CM | POA: Diagnosis not present

## 2017-10-09 DIAGNOSIS — Z0000 Encounter for general adult medical examination without abnormal findings: Secondary | ICD-10-CM | POA: Diagnosis not present

## 2017-10-09 DIAGNOSIS — E663 Overweight: Secondary | ICD-10-CM | POA: Diagnosis not present

## 2017-10-27 DIAGNOSIS — H524 Presbyopia: Secondary | ICD-10-CM | POA: Diagnosis not present

## 2017-10-27 DIAGNOSIS — H5212 Myopia, left eye: Secondary | ICD-10-CM | POA: Diagnosis not present

## 2017-10-27 DIAGNOSIS — E119 Type 2 diabetes mellitus without complications: Secondary | ICD-10-CM | POA: Diagnosis not present

## 2017-10-27 DIAGNOSIS — H2513 Age-related nuclear cataract, bilateral: Secondary | ICD-10-CM | POA: Diagnosis not present

## 2018-01-18 DIAGNOSIS — E119 Type 2 diabetes mellitus without complications: Secondary | ICD-10-CM | POA: Diagnosis not present

## 2018-01-18 DIAGNOSIS — E1165 Type 2 diabetes mellitus with hyperglycemia: Secondary | ICD-10-CM | POA: Diagnosis not present

## 2018-01-18 DIAGNOSIS — R438 Other disturbances of smell and taste: Secondary | ICD-10-CM | POA: Diagnosis not present

## 2018-01-18 DIAGNOSIS — Z7984 Long term (current) use of oral hypoglycemic drugs: Secondary | ICD-10-CM | POA: Diagnosis not present

## 2018-05-07 DIAGNOSIS — F325 Major depressive disorder, single episode, in full remission: Secondary | ICD-10-CM | POA: Diagnosis not present

## 2018-05-07 DIAGNOSIS — G479 Sleep disorder, unspecified: Secondary | ICD-10-CM | POA: Diagnosis not present

## 2018-05-07 DIAGNOSIS — J309 Allergic rhinitis, unspecified: Secondary | ICD-10-CM | POA: Diagnosis not present

## 2018-05-07 DIAGNOSIS — Z6829 Body mass index (BMI) 29.0-29.9, adult: Secondary | ICD-10-CM | POA: Diagnosis not present

## 2018-05-07 DIAGNOSIS — R739 Hyperglycemia, unspecified: Secondary | ICD-10-CM | POA: Diagnosis not present

## 2018-05-07 DIAGNOSIS — E1169 Type 2 diabetes mellitus with other specified complication: Secondary | ICD-10-CM | POA: Diagnosis not present

## 2018-05-07 DIAGNOSIS — E782 Mixed hyperlipidemia: Secondary | ICD-10-CM | POA: Diagnosis not present

## 2018-05-07 DIAGNOSIS — E663 Overweight: Secondary | ICD-10-CM | POA: Diagnosis not present

## 2018-06-19 ENCOUNTER — Other Ambulatory Visit: Payer: Self-pay | Admitting: Family Medicine

## 2018-06-19 DIAGNOSIS — Z1231 Encounter for screening mammogram for malignant neoplasm of breast: Secondary | ICD-10-CM

## 2018-07-24 ENCOUNTER — Ambulatory Visit
Admission: RE | Admit: 2018-07-24 | Discharge: 2018-07-24 | Disposition: A | Discharge status: Home / Self Care | Payer: 59 | Source: Ambulatory Visit | Attending: Family Medicine | Admitting: Family Medicine

## 2018-07-24 DIAGNOSIS — Z1231 Encounter for screening mammogram for malignant neoplasm of breast: Secondary | ICD-10-CM | POA: Diagnosis not present

## 2018-07-24 IMAGING — MG DIGITAL SCREENING BILATERAL MAMMOGRAM WITH TOMO AND CAD
8 series · 8 of 24 positions shown · non-contrast
Comparison: Previous exam(s).

CLINICAL DATA: Screening.

EXAM:
DIGITAL SCREENING BILATERAL MAMMOGRAM WITH TOMO AND CAD

[L CC synth-2D]
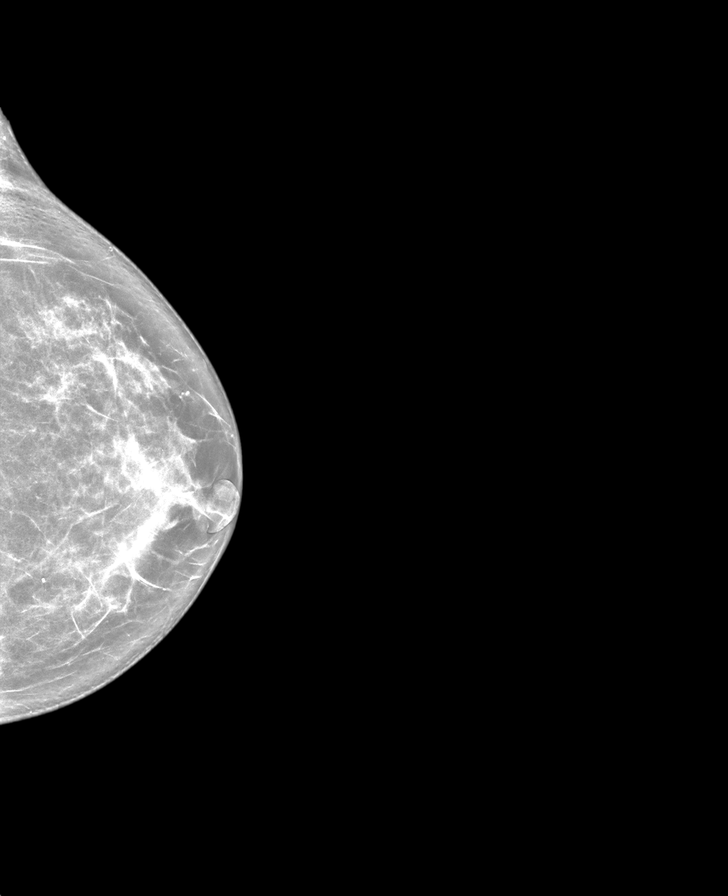

[L MLO synth-2D]
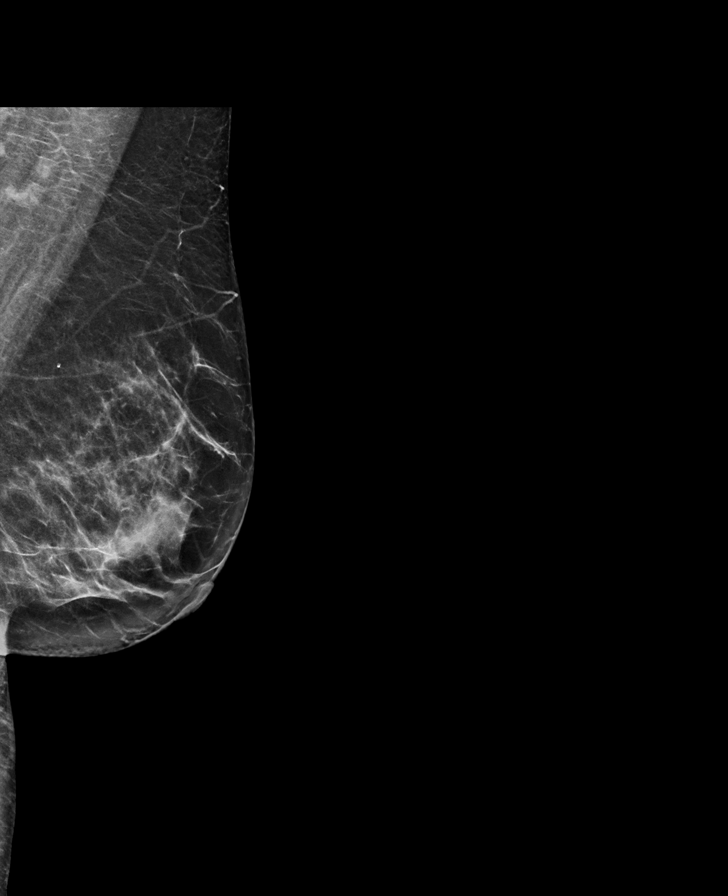

[R MLO synth-2D]
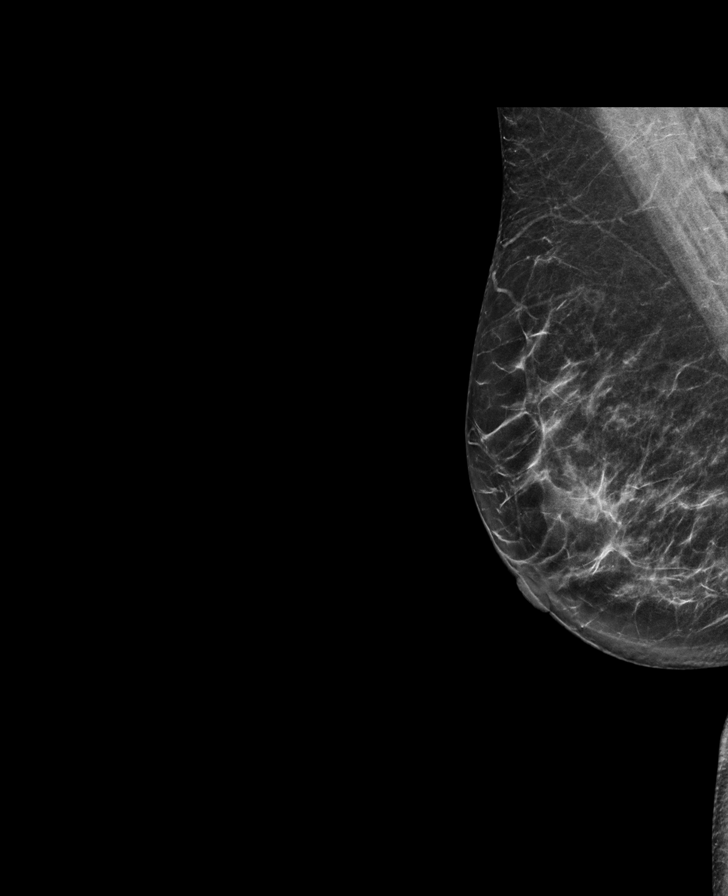

[R CC synth-2D]
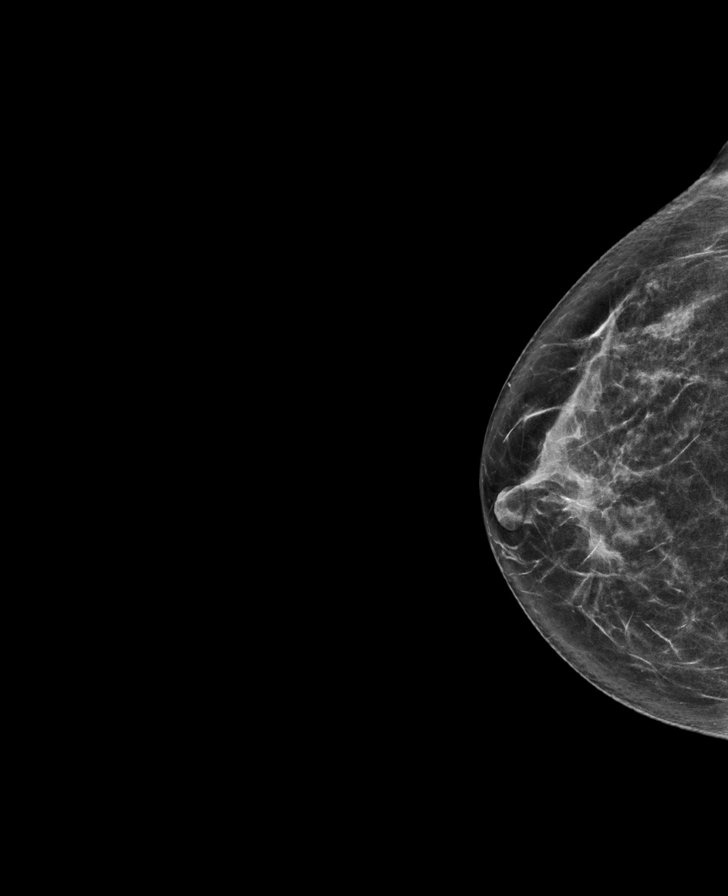

[R CC tomo · tomo slice 35/68.0]
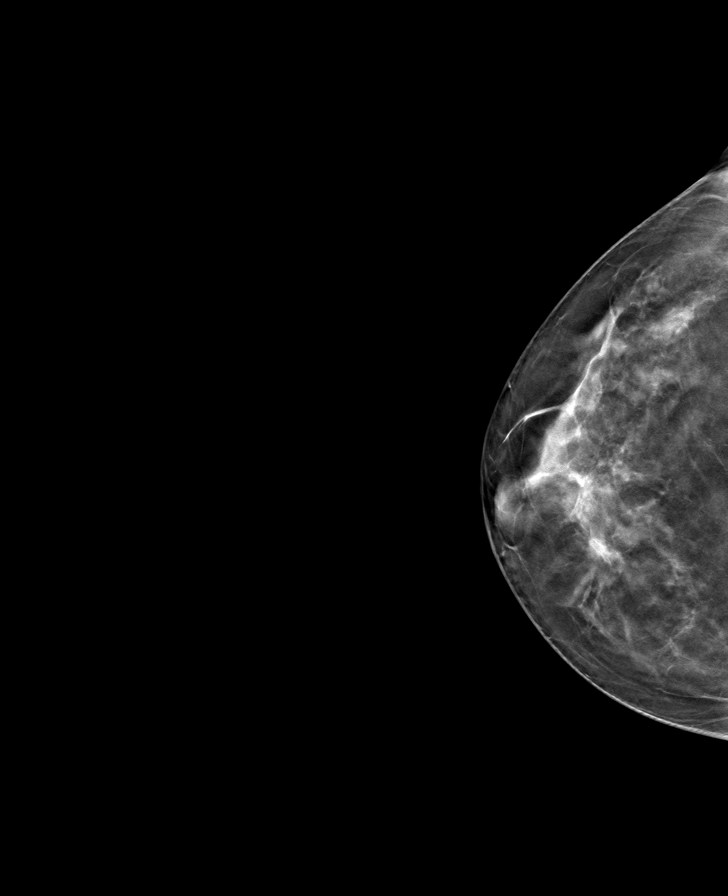

[R MLO tomo · tomo slice 35/69.0]
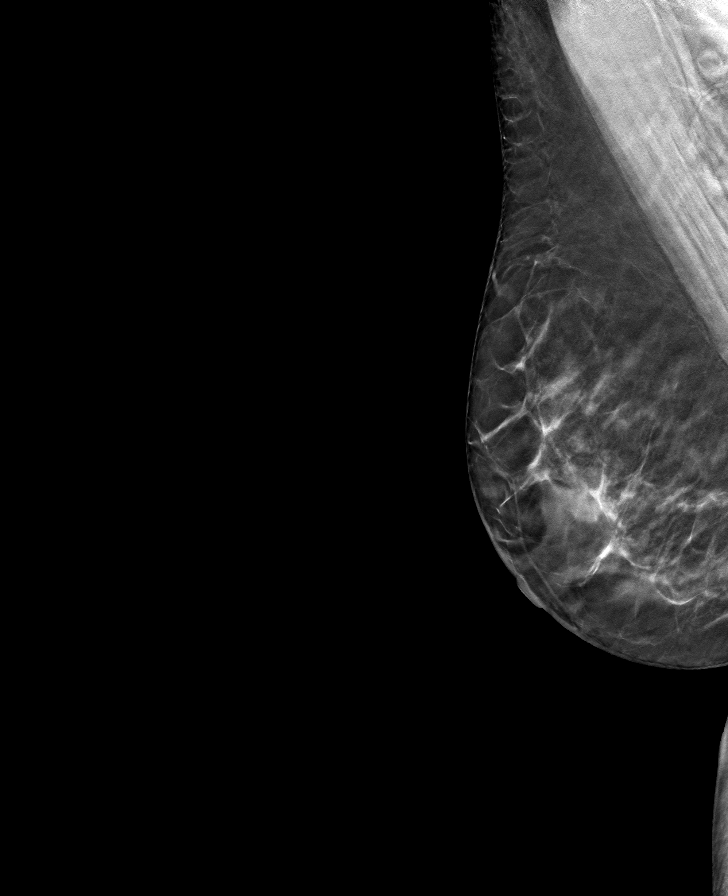

[L CC tomo · tomo slice 36/71.0]
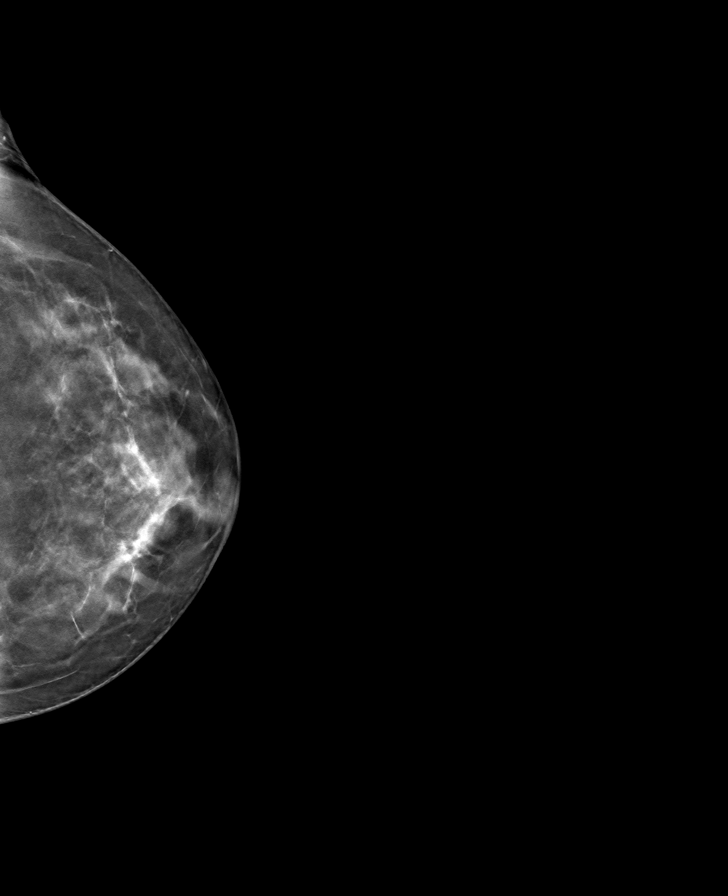

[L MLO tomo · tomo slice 37/73.0]
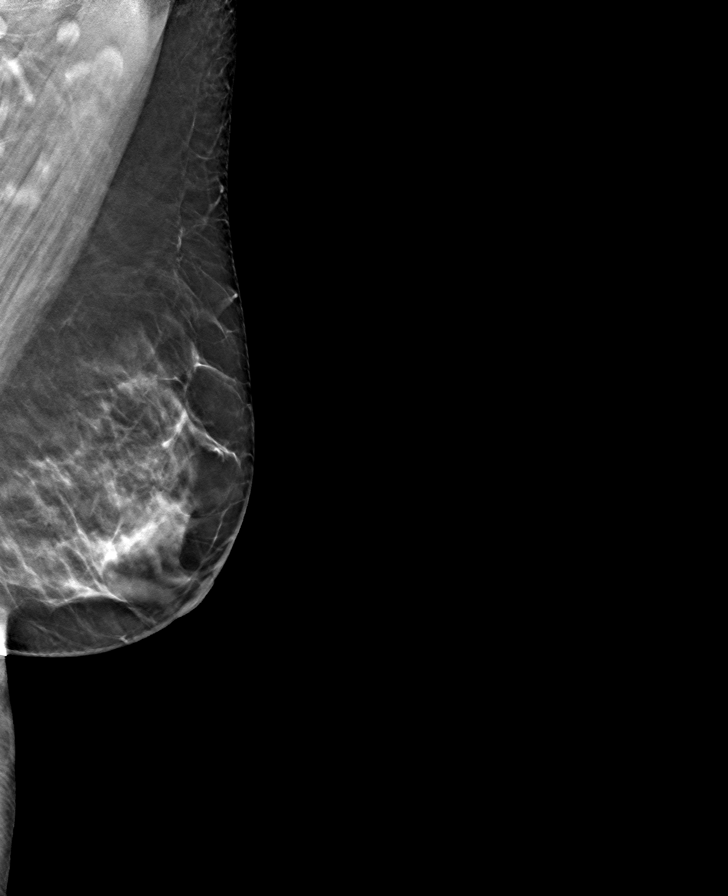

[8 of 24 positions shown; findings below may reference images not displayed]

ACR Breast Density Category b: There are scattered areas of
fibroglandular density.
FINDINGS: There are no findings suspicious for malignancy. Images were
processed with CAD.
IMPRESSION: No mammographic evidence of malignancy. A result letter of this
screening mammogram will be mailed directly to the patient.

RECOMMENDATION:
Screening mammogram in one year. (Code:[TQ])

BI-RADS CATEGORY  1: Negative.

## 2018-10-23 DIAGNOSIS — E1169 Type 2 diabetes mellitus with other specified complication: Secondary | ICD-10-CM | POA: Diagnosis not present

## 2018-10-23 DIAGNOSIS — F325 Major depressive disorder, single episode, in full remission: Secondary | ICD-10-CM | POA: Diagnosis not present

## 2018-10-23 DIAGNOSIS — G479 Sleep disorder, unspecified: Secondary | ICD-10-CM | POA: Diagnosis not present

## 2018-10-23 DIAGNOSIS — I10 Essential (primary) hypertension: Secondary | ICD-10-CM | POA: Diagnosis not present

## 2018-10-23 DIAGNOSIS — J309 Allergic rhinitis, unspecified: Secondary | ICD-10-CM | POA: Diagnosis not present

## 2018-10-23 DIAGNOSIS — E782 Mixed hyperlipidemia: Secondary | ICD-10-CM | POA: Diagnosis not present

## 2018-10-26 DIAGNOSIS — E119 Type 2 diabetes mellitus without complications: Secondary | ICD-10-CM | POA: Diagnosis not present

## 2018-10-26 DIAGNOSIS — R7309 Other abnormal glucose: Secondary | ICD-10-CM | POA: Diagnosis not present

## 2018-12-20 DIAGNOSIS — H2513 Age-related nuclear cataract, bilateral: Secondary | ICD-10-CM | POA: Diagnosis not present

## 2018-12-20 DIAGNOSIS — Z7984 Long term (current) use of oral hypoglycemic drugs: Secondary | ICD-10-CM | POA: Diagnosis not present

## 2018-12-20 DIAGNOSIS — E119 Type 2 diabetes mellitus without complications: Secondary | ICD-10-CM | POA: Diagnosis not present

## 2019-02-22 DIAGNOSIS — F325 Major depressive disorder, single episode, in full remission: Secondary | ICD-10-CM | POA: Diagnosis not present

## 2019-02-22 DIAGNOSIS — E782 Mixed hyperlipidemia: Secondary | ICD-10-CM | POA: Diagnosis not present

## 2019-02-22 DIAGNOSIS — Z7984 Long term (current) use of oral hypoglycemic drugs: Secondary | ICD-10-CM | POA: Diagnosis not present

## 2019-02-22 DIAGNOSIS — E1169 Type 2 diabetes mellitus with other specified complication: Secondary | ICD-10-CM | POA: Diagnosis not present

## 2019-02-22 DIAGNOSIS — Z79899 Other long term (current) drug therapy: Secondary | ICD-10-CM | POA: Diagnosis not present

## 2019-03-04 DIAGNOSIS — E782 Mixed hyperlipidemia: Secondary | ICD-10-CM | POA: Diagnosis not present

## 2019-03-04 DIAGNOSIS — Z79899 Other long term (current) drug therapy: Secondary | ICD-10-CM | POA: Diagnosis not present

## 2019-03-04 DIAGNOSIS — F325 Major depressive disorder, single episode, in full remission: Secondary | ICD-10-CM | POA: Diagnosis not present

## 2019-03-04 DIAGNOSIS — E1169 Type 2 diabetes mellitus with other specified complication: Secondary | ICD-10-CM | POA: Diagnosis not present

## 2019-07-11 DIAGNOSIS — M9901 Segmental and somatic dysfunction of cervical region: Secondary | ICD-10-CM | POA: Diagnosis not present

## 2019-07-11 DIAGNOSIS — M50321 Other cervical disc degeneration at C4-C5 level: Secondary | ICD-10-CM | POA: Diagnosis not present

## 2019-07-11 DIAGNOSIS — M9903 Segmental and somatic dysfunction of lumbar region: Secondary | ICD-10-CM | POA: Diagnosis not present

## 2019-07-11 DIAGNOSIS — M9902 Segmental and somatic dysfunction of thoracic region: Secondary | ICD-10-CM | POA: Diagnosis not present

## 2019-07-11 DIAGNOSIS — M50121 Cervical disc disorder at C4-C5 level with radiculopathy: Secondary | ICD-10-CM | POA: Diagnosis not present

## 2019-07-16 DIAGNOSIS — E1169 Type 2 diabetes mellitus with other specified complication: Secondary | ICD-10-CM | POA: Diagnosis not present

## 2019-07-22 DIAGNOSIS — M9903 Segmental and somatic dysfunction of lumbar region: Secondary | ICD-10-CM | POA: Diagnosis not present

## 2019-07-22 DIAGNOSIS — M50121 Cervical disc disorder at C4-C5 level with radiculopathy: Secondary | ICD-10-CM | POA: Diagnosis not present

## 2019-07-22 DIAGNOSIS — M9901 Segmental and somatic dysfunction of cervical region: Secondary | ICD-10-CM | POA: Diagnosis not present

## 2019-07-22 DIAGNOSIS — M9902 Segmental and somatic dysfunction of thoracic region: Secondary | ICD-10-CM | POA: Diagnosis not present

## 2019-07-22 DIAGNOSIS — M50321 Other cervical disc degeneration at C4-C5 level: Secondary | ICD-10-CM | POA: Diagnosis not present

## 2019-08-07 ENCOUNTER — Other Ambulatory Visit: Payer: Self-pay | Admitting: Family Medicine

## 2019-08-07 DIAGNOSIS — Z1231 Encounter for screening mammogram for malignant neoplasm of breast: Secondary | ICD-10-CM

## 2019-08-08 ENCOUNTER — Ambulatory Visit
Admission: RE | Admit: 2019-08-08 | Discharge: 2019-08-08 | Disposition: A | Discharge status: Home / Self Care | Payer: PRIVATE HEALTH INSURANCE | Source: Ambulatory Visit

## 2019-08-08 ENCOUNTER — Other Ambulatory Visit: Payer: Self-pay

## 2019-08-08 DIAGNOSIS — D229 Melanocytic nevi, unspecified: Secondary | ICD-10-CM | POA: Diagnosis not present

## 2019-08-08 DIAGNOSIS — Z1231 Encounter for screening mammogram for malignant neoplasm of breast: Secondary | ICD-10-CM

## 2019-08-08 DIAGNOSIS — D485 Neoplasm of uncertain behavior of skin: Secondary | ICD-10-CM | POA: Diagnosis not present

## 2019-08-08 IMAGING — MG DIGITAL SCREENING BILAT W/ TOMO W/ CAD
8 series · 9 of 24 positions shown · non-contrast
Comparison: Previous exam(s).

CLINICAL DATA: Screening.

EXAM:
DIGITAL SCREENING BILATERAL MAMMOGRAM WITH TOMO AND CAD

[R MLO synth-2D]
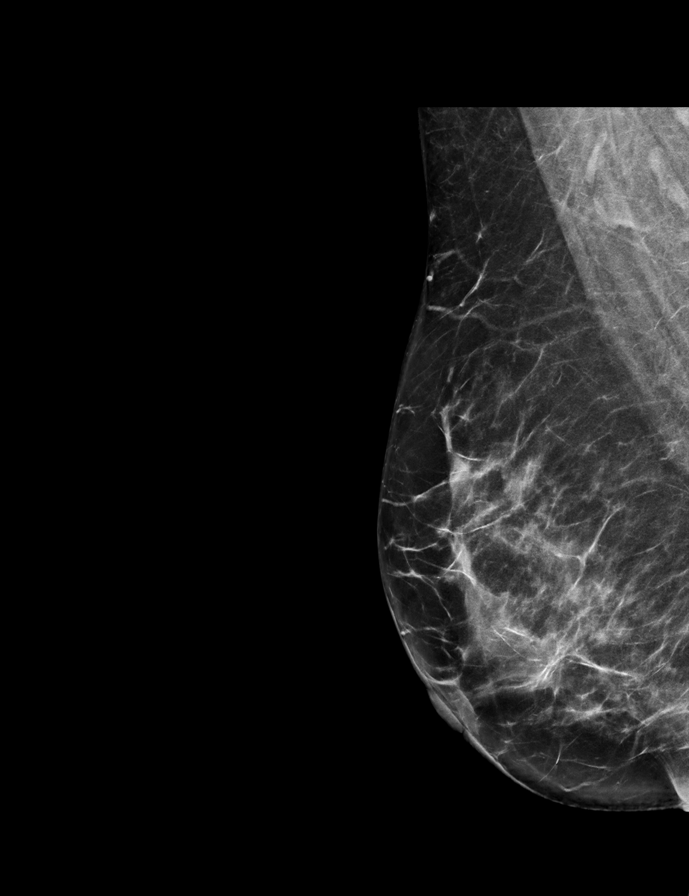

[L MLO synth-2D]
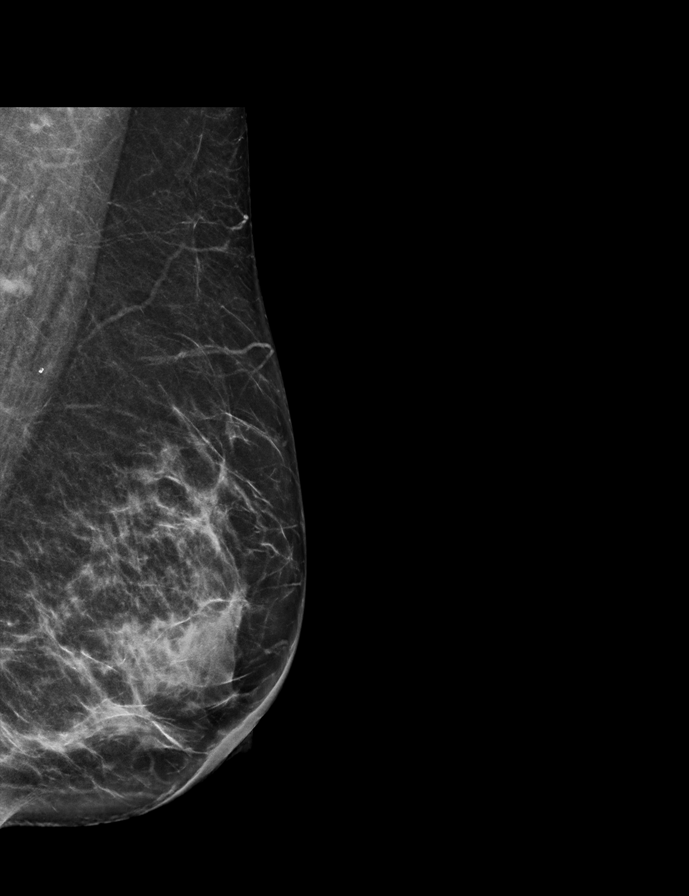

[R CC synth-2D]
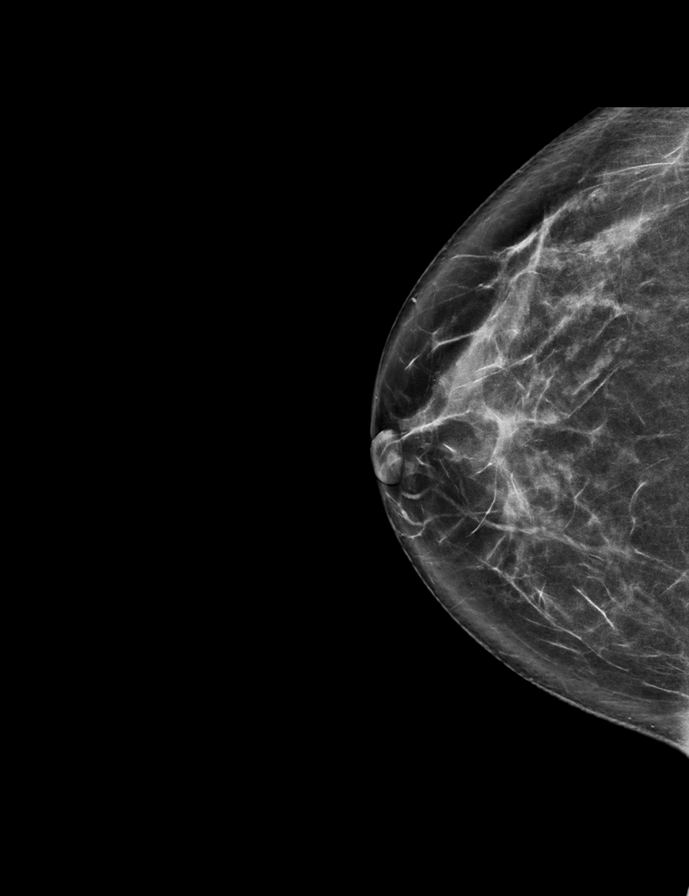

[L CC synth-2D]
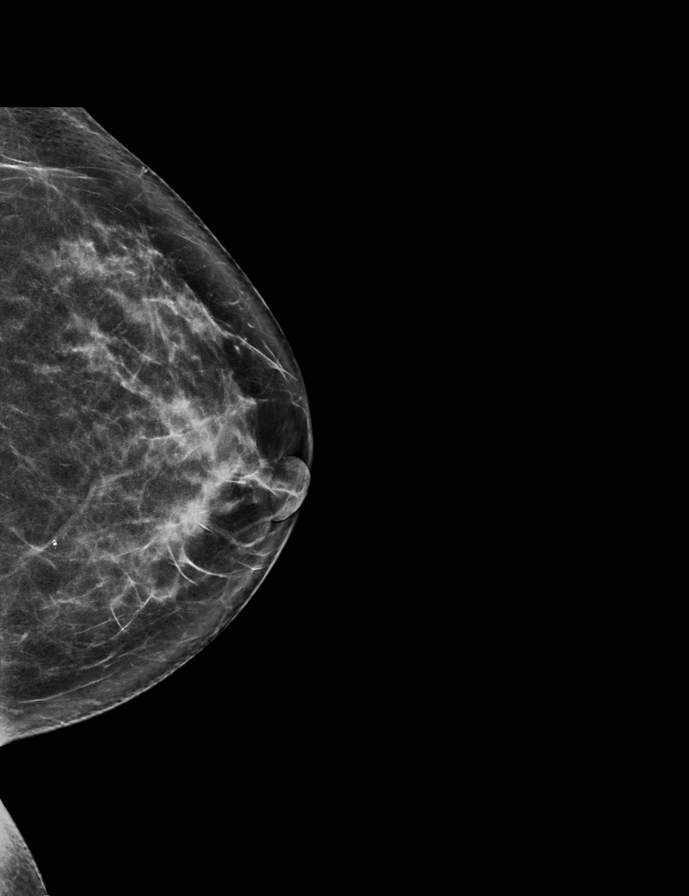

[R CC tomo · 2 of 72 frames shown]
[frame 24/72]
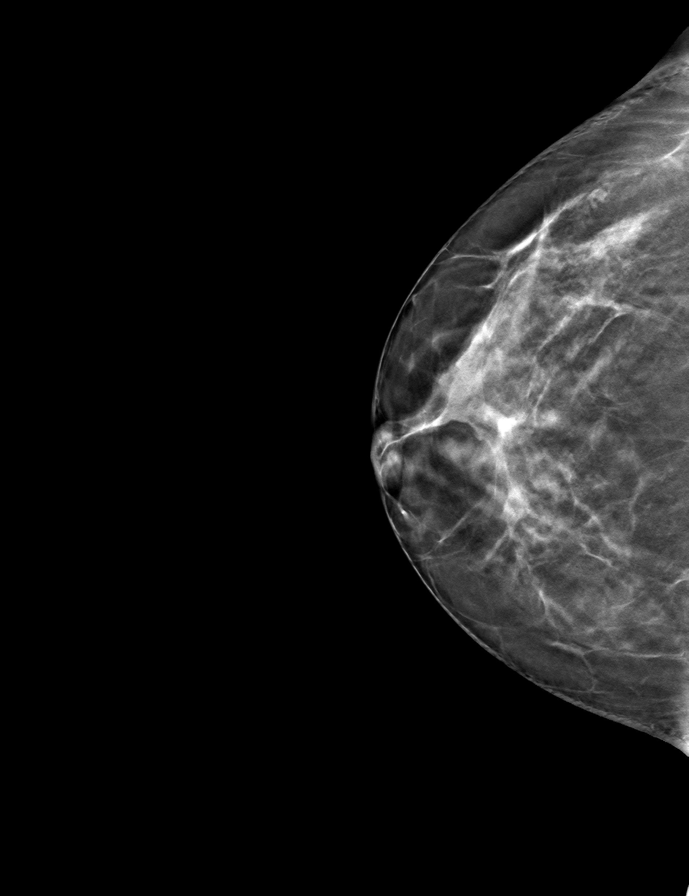
[frame 37/72]
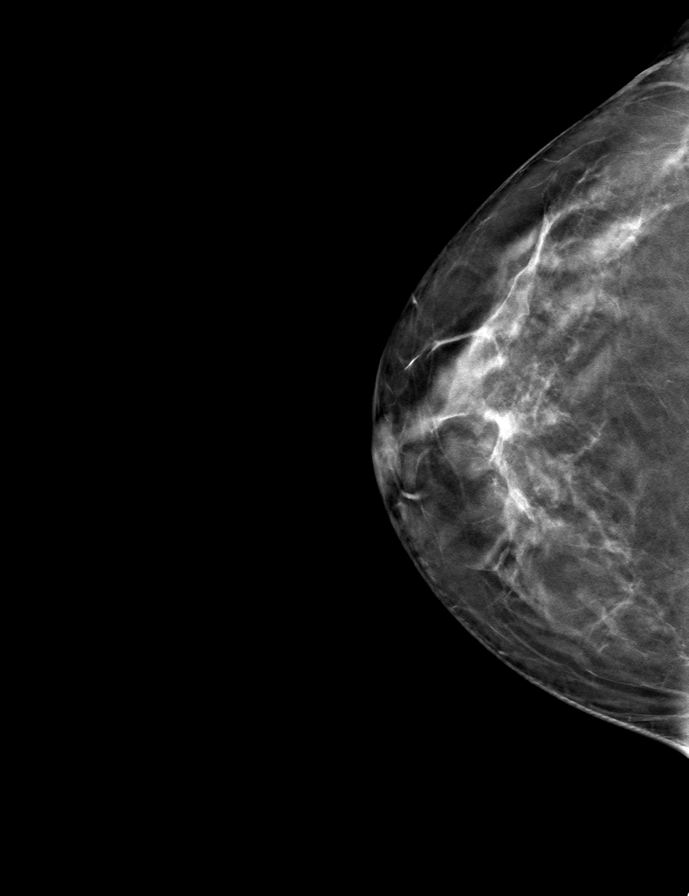

[L MLO tomo · tomo slice 33/64.0]
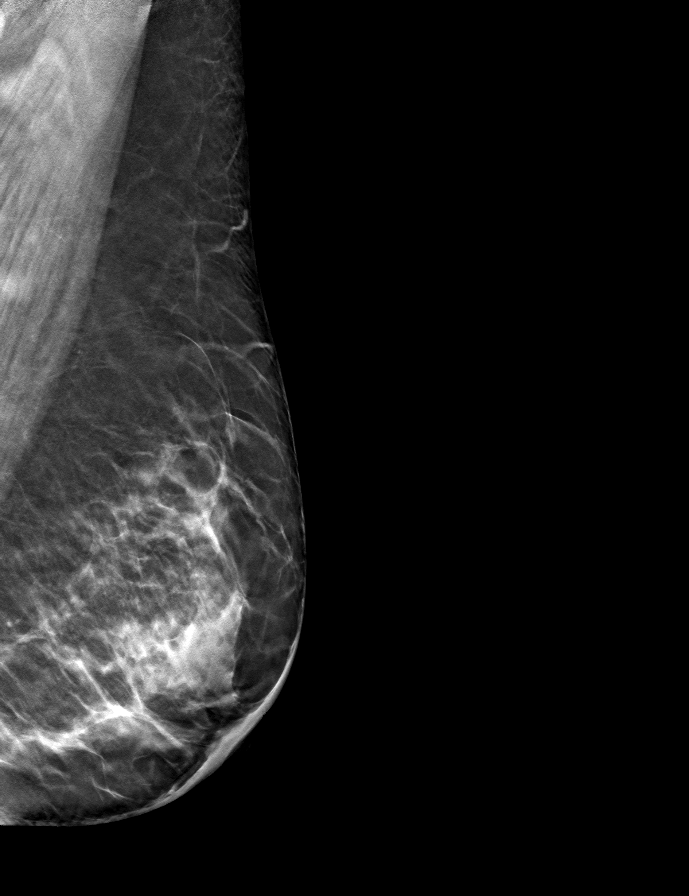

[R MLO tomo · tomo slice 37/73.0]
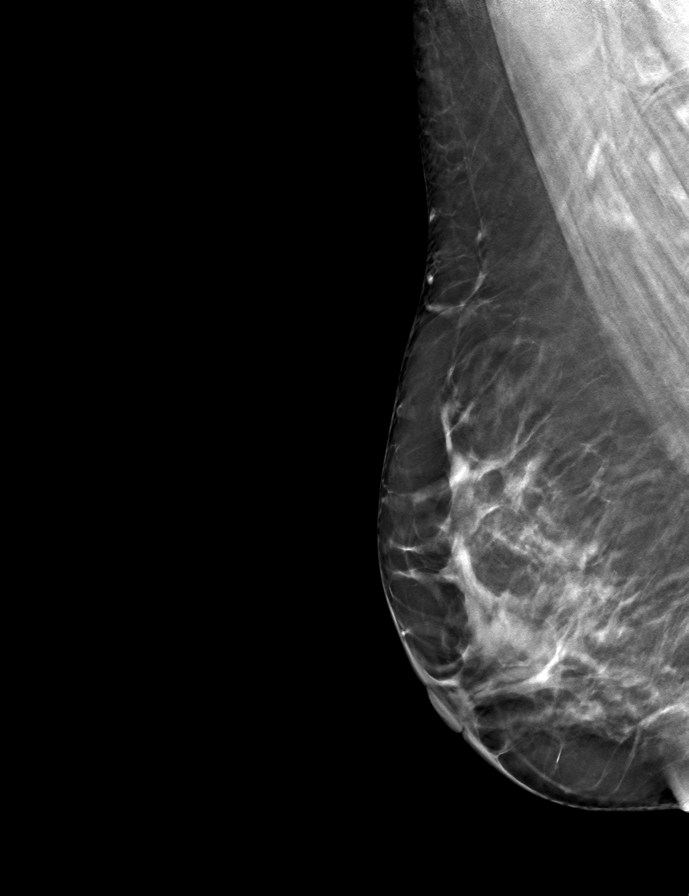

[L CC tomo · tomo slice 35/69.0]
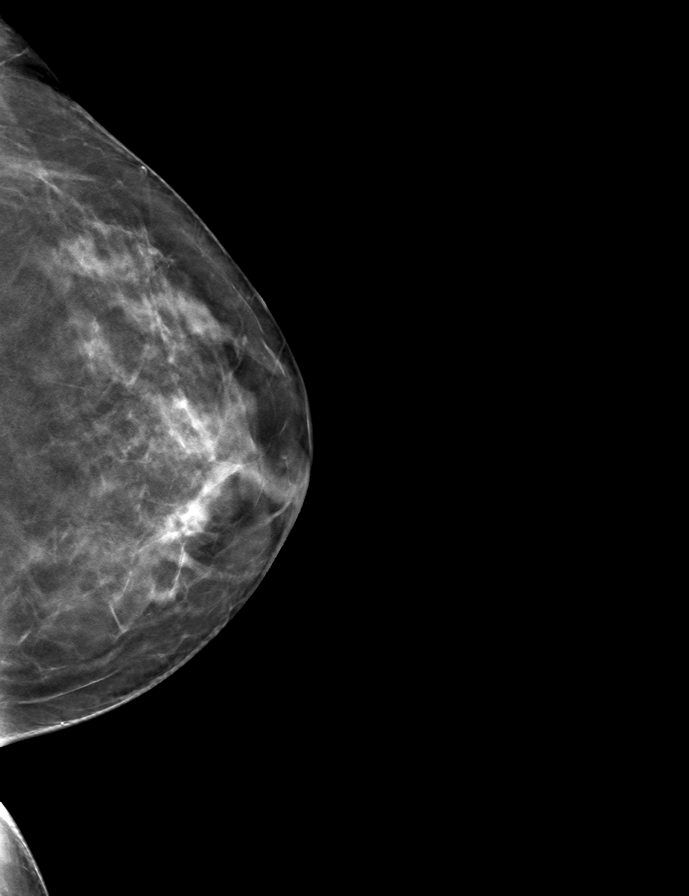

[9 of 24 positions shown; findings below may reference images not displayed]

ACR Breast Density Category b: There are scattered areas of
fibroglandular density.
FINDINGS: There are no findings suspicious for malignancy. Images were
processed with CAD.
IMPRESSION: No mammographic evidence of malignancy. A result letter of this
screening mammogram will be mailed directly to the patient.

RECOMMENDATION:
Screening mammogram in one year. (Code:[TQ])

BI-RADS CATEGORY  1: Negative.

## 2019-10-11 ENCOUNTER — Other Ambulatory Visit (HOSPITAL_COMMUNITY): Payer: Self-pay | Admitting: Family Medicine

## 2019-10-24 ENCOUNTER — Ambulatory Visit: Payer: PRIVATE HEALTH INSURANCE | Admitting: Physician Assistant

## 2019-11-25 ENCOUNTER — Other Ambulatory Visit: Payer: Self-pay | Admitting: Family Medicine

## 2019-11-25 ENCOUNTER — Other Ambulatory Visit (HOSPITAL_COMMUNITY)
Admission: RE | Admit: 2019-11-25 | Discharge: 2019-11-25 | Disposition: A | Discharge status: Home / Self Care | Payer: 59 | Source: Ambulatory Visit | Attending: Family Medicine | Admitting: Family Medicine

## 2019-11-25 DIAGNOSIS — Z0000 Encounter for general adult medical examination without abnormal findings: Secondary | ICD-10-CM | POA: Diagnosis not present

## 2019-11-25 DIAGNOSIS — E663 Overweight: Secondary | ICD-10-CM | POA: Diagnosis not present

## 2019-11-25 DIAGNOSIS — R198 Other specified symptoms and signs involving the digestive system and abdomen: Secondary | ICD-10-CM | POA: Diagnosis not present

## 2019-11-25 DIAGNOSIS — Z6827 Body mass index (BMI) 27.0-27.9, adult: Secondary | ICD-10-CM | POA: Diagnosis not present

## 2019-11-25 DIAGNOSIS — E782 Mixed hyperlipidemia: Secondary | ICD-10-CM | POA: Diagnosis not present

## 2019-11-25 DIAGNOSIS — I10 Essential (primary) hypertension: Secondary | ICD-10-CM | POA: Diagnosis not present

## 2019-11-25 DIAGNOSIS — Z124 Encounter for screening for malignant neoplasm of cervix: Secondary | ICD-10-CM | POA: Diagnosis not present

## 2019-11-25 DIAGNOSIS — G479 Sleep disorder, unspecified: Secondary | ICD-10-CM | POA: Diagnosis not present

## 2019-11-25 DIAGNOSIS — F325 Major depressive disorder, single episode, in full remission: Secondary | ICD-10-CM | POA: Diagnosis not present

## 2019-11-25 DIAGNOSIS — E1169 Type 2 diabetes mellitus with other specified complication: Secondary | ICD-10-CM | POA: Diagnosis not present

## 2019-11-27 LAB — CYTOLOGY - PAP
Comment: NEGATIVE
Diagnosis: NEGATIVE
High risk HPV: NEGATIVE

## 2019-12-23 DIAGNOSIS — M9903 Segmental and somatic dysfunction of lumbar region: Secondary | ICD-10-CM | POA: Diagnosis not present

## 2019-12-23 DIAGNOSIS — M5012 Mid-cervical disc disorder, unspecified level: Secondary | ICD-10-CM | POA: Diagnosis not present

## 2019-12-23 DIAGNOSIS — M9902 Segmental and somatic dysfunction of thoracic region: Secondary | ICD-10-CM | POA: Diagnosis not present

## 2019-12-23 DIAGNOSIS — M9901 Segmental and somatic dysfunction of cervical region: Secondary | ICD-10-CM | POA: Diagnosis not present

## 2019-12-23 DIAGNOSIS — M50321 Other cervical disc degeneration at C4-C5 level: Secondary | ICD-10-CM | POA: Diagnosis not present

## 2019-12-26 DIAGNOSIS — H524 Presbyopia: Secondary | ICD-10-CM | POA: Diagnosis not present

## 2019-12-26 DIAGNOSIS — H25813 Combined forms of age-related cataract, bilateral: Secondary | ICD-10-CM | POA: Diagnosis not present

## 2019-12-26 DIAGNOSIS — E1136 Type 2 diabetes mellitus with diabetic cataract: Secondary | ICD-10-CM | POA: Diagnosis not present

## 2020-01-02 DIAGNOSIS — M9902 Segmental and somatic dysfunction of thoracic region: Secondary | ICD-10-CM | POA: Diagnosis not present

## 2020-01-02 DIAGNOSIS — M9901 Segmental and somatic dysfunction of cervical region: Secondary | ICD-10-CM | POA: Diagnosis not present

## 2020-01-02 DIAGNOSIS — M9903 Segmental and somatic dysfunction of lumbar region: Secondary | ICD-10-CM | POA: Diagnosis not present

## 2020-01-02 DIAGNOSIS — M5012 Mid-cervical disc disorder, unspecified level: Secondary | ICD-10-CM | POA: Diagnosis not present

## 2020-01-02 DIAGNOSIS — M50321 Other cervical disc degeneration at C4-C5 level: Secondary | ICD-10-CM | POA: Diagnosis not present

## 2020-01-15 ENCOUNTER — Other Ambulatory Visit (HOSPITAL_COMMUNITY): Payer: Self-pay | Admitting: Family Medicine

## 2020-01-20 ENCOUNTER — Other Ambulatory Visit (HOSPITAL_COMMUNITY): Payer: Self-pay | Admitting: Internal Medicine

## 2020-01-20 DIAGNOSIS — M50321 Other cervical disc degeneration at C4-C5 level: Secondary | ICD-10-CM | POA: Diagnosis not present

## 2020-01-20 DIAGNOSIS — M9902 Segmental and somatic dysfunction of thoracic region: Secondary | ICD-10-CM | POA: Diagnosis not present

## 2020-01-20 DIAGNOSIS — M9903 Segmental and somatic dysfunction of lumbar region: Secondary | ICD-10-CM | POA: Diagnosis not present

## 2020-01-20 DIAGNOSIS — M9901 Segmental and somatic dysfunction of cervical region: Secondary | ICD-10-CM | POA: Diagnosis not present

## 2020-01-20 DIAGNOSIS — I10 Essential (primary) hypertension: Secondary | ICD-10-CM | POA: Diagnosis not present

## 2020-01-20 DIAGNOSIS — E1165 Type 2 diabetes mellitus with hyperglycemia: Secondary | ICD-10-CM | POA: Diagnosis not present

## 2020-01-20 DIAGNOSIS — E785 Hyperlipidemia, unspecified: Secondary | ICD-10-CM | POA: Diagnosis not present

## 2020-01-20 DIAGNOSIS — M5012 Mid-cervical disc disorder, unspecified level: Secondary | ICD-10-CM | POA: Diagnosis not present

## 2020-01-20 DIAGNOSIS — Z6826 Body mass index (BMI) 26.0-26.9, adult: Secondary | ICD-10-CM | POA: Diagnosis not present

## 2020-01-21 DIAGNOSIS — E1165 Type 2 diabetes mellitus with hyperglycemia: Secondary | ICD-10-CM | POA: Diagnosis not present

## 2020-02-03 DIAGNOSIS — M50321 Other cervical disc degeneration at C4-C5 level: Secondary | ICD-10-CM | POA: Diagnosis not present

## 2020-02-03 DIAGNOSIS — M9903 Segmental and somatic dysfunction of lumbar region: Secondary | ICD-10-CM | POA: Diagnosis not present

## 2020-02-03 DIAGNOSIS — M9902 Segmental and somatic dysfunction of thoracic region: Secondary | ICD-10-CM | POA: Diagnosis not present

## 2020-02-03 DIAGNOSIS — M5012 Mid-cervical disc disorder, unspecified level: Secondary | ICD-10-CM | POA: Diagnosis not present

## 2020-02-03 DIAGNOSIS — M9901 Segmental and somatic dysfunction of cervical region: Secondary | ICD-10-CM | POA: Diagnosis not present

## 2020-02-18 DIAGNOSIS — M50321 Other cervical disc degeneration at C4-C5 level: Secondary | ICD-10-CM | POA: Diagnosis not present

## 2020-02-18 DIAGNOSIS — M9902 Segmental and somatic dysfunction of thoracic region: Secondary | ICD-10-CM | POA: Diagnosis not present

## 2020-02-18 DIAGNOSIS — M9903 Segmental and somatic dysfunction of lumbar region: Secondary | ICD-10-CM | POA: Diagnosis not present

## 2020-02-18 DIAGNOSIS — M9901 Segmental and somatic dysfunction of cervical region: Secondary | ICD-10-CM | POA: Diagnosis not present

## 2020-02-18 DIAGNOSIS — M5012 Mid-cervical disc disorder, unspecified level: Secondary | ICD-10-CM | POA: Diagnosis not present

## 2020-02-26 DIAGNOSIS — E1165 Type 2 diabetes mellitus with hyperglycemia: Secondary | ICD-10-CM | POA: Diagnosis not present

## 2020-02-26 DIAGNOSIS — I10 Essential (primary) hypertension: Secondary | ICD-10-CM | POA: Diagnosis not present

## 2020-02-26 DIAGNOSIS — Z6826 Body mass index (BMI) 26.0-26.9, adult: Secondary | ICD-10-CM | POA: Diagnosis not present

## 2020-02-26 DIAGNOSIS — E785 Hyperlipidemia, unspecified: Secondary | ICD-10-CM | POA: Diagnosis not present

## 2020-03-03 DIAGNOSIS — H9041 Sensorineural hearing loss, unilateral, right ear, with unrestricted hearing on the contralateral side: Secondary | ICD-10-CM | POA: Diagnosis not present

## 2020-03-03 DIAGNOSIS — H9191 Unspecified hearing loss, right ear: Secondary | ICD-10-CM | POA: Diagnosis not present

## 2020-03-03 DIAGNOSIS — H9311 Tinnitus, right ear: Secondary | ICD-10-CM | POA: Diagnosis not present

## 2020-03-04 ENCOUNTER — Other Ambulatory Visit: Payer: Self-pay | Admitting: Otolaryngology

## 2020-03-04 DIAGNOSIS — M9902 Segmental and somatic dysfunction of thoracic region: Secondary | ICD-10-CM | POA: Diagnosis not present

## 2020-03-04 DIAGNOSIS — M50321 Other cervical disc degeneration at C4-C5 level: Secondary | ICD-10-CM | POA: Diagnosis not present

## 2020-03-04 DIAGNOSIS — M9901 Segmental and somatic dysfunction of cervical region: Secondary | ICD-10-CM | POA: Diagnosis not present

## 2020-03-04 DIAGNOSIS — H9191 Unspecified hearing loss, right ear: Secondary | ICD-10-CM

## 2020-03-04 DIAGNOSIS — M9903 Segmental and somatic dysfunction of lumbar region: Secondary | ICD-10-CM | POA: Diagnosis not present

## 2020-03-04 DIAGNOSIS — M50121 Cervical disc disorder at C4-C5 level with radiculopathy: Secondary | ICD-10-CM | POA: Diagnosis not present

## 2020-03-04 DIAGNOSIS — H9311 Tinnitus, right ear: Secondary | ICD-10-CM

## 2020-03-16 ENCOUNTER — Other Ambulatory Visit: Payer: Self-pay

## 2020-03-16 ENCOUNTER — Ambulatory Visit (HOSPITAL_COMMUNITY)
Admission: RE | Admit: 2020-03-16 | Discharge: 2020-03-16 | Disposition: A | Discharge status: Home / Self Care | Payer: 59 | Source: Ambulatory Visit | Attending: Otolaryngology | Admitting: Otolaryngology

## 2020-03-16 DIAGNOSIS — H9311 Tinnitus, right ear: Secondary | ICD-10-CM | POA: Insufficient documentation

## 2020-03-16 DIAGNOSIS — H9191 Unspecified hearing loss, right ear: Secondary | ICD-10-CM | POA: Insufficient documentation

## 2020-03-16 DIAGNOSIS — G9389 Other specified disorders of brain: Secondary | ICD-10-CM | POA: Diagnosis not present

## 2020-03-16 IMAGING — MR MR BRAIN/IAC WO/W CM
21 of 22 series · 41 of 48 positions shown · IV contrast (gadavist)
Comparison: None.

CLINICAL DATA: Right-sided hearing loss and tinnitus.

EXAM:
MRI HEAD WITHOUT AND WITH CONTRAST
TECHNIQUE: Multiplanar, multiecho pulse sequences of the brain and surrounding
structures were obtained without and with intravenous contrast.
CONTRAST:  6mL GADAVIST GADOBUTROL 1 MMOL/ML IV SOLN

[Series 5: DWI · axial · 3.0mm · 1.36mm/px · z∈[-42,+99]mm · 4 of 96 slices shown (1 of 4)]
[im 1/96]
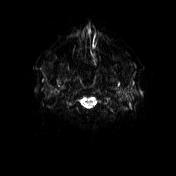
[im 32/96]
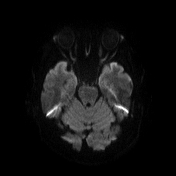
[im 64/96]
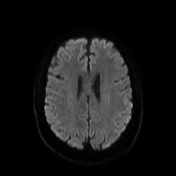
[im 96/96]
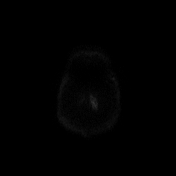

[Series 6: DWI · axial · 3.0mm · 1.36mm/px · z∈[-42,+99]mm · 2 of 48 slices shown (2 of 4)]
[im 1/48]
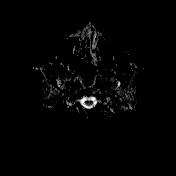
[im 48/48]
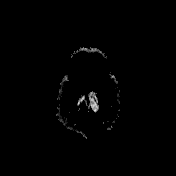

[Series 7: T1 · sagittal · 5.0mm · 0.75mm/px · 1 of 24 slices shown (1 of 6)]
[im 1/24]
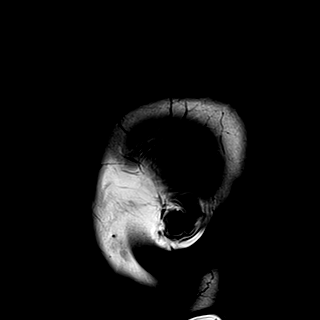

[Series 8: T2 · axial · 5.0mm · 0.62mm/px · 1 of 23 slices shown]
[im 1/23]
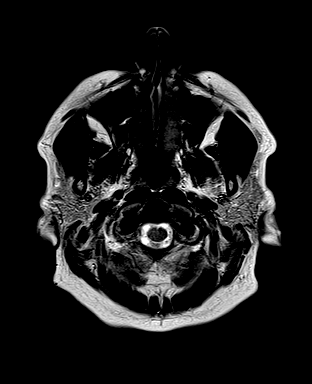

[Series 9: mip_images(sw) · axial · 24.0mm · 0.75mm/px · z∈[-35,+96]mm · 2 of 45 slices shown]
[im 1/45]
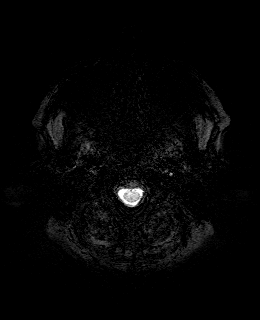
[im 45/45]
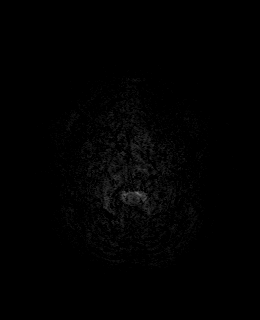

[Series 10: swi_images · axial · 3.0mm · 0.75mm/px · z∈[-46,+107]mm · 2 of 52 slices shown]
[im 1/52]
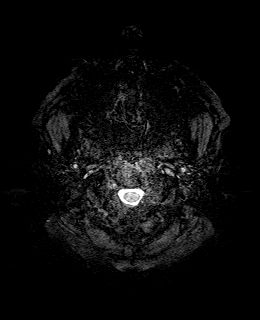
[im 52/52]
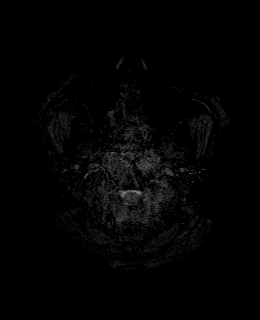

[Series 11: FLAIR · axial · 3.0mm · 0.75mm/px · z∈[-46,+107]mm · 2 of 52 slices shown]
[im 1/52]
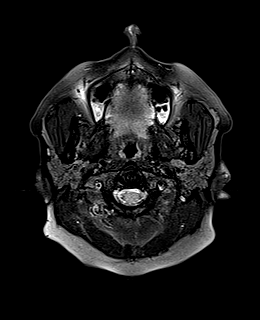
[im 52/52]
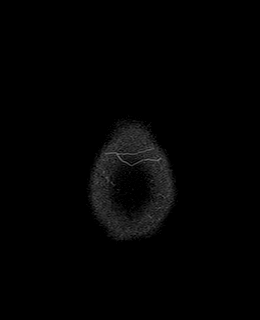

[Series 12: T1 · axial · 1.0mm · 0.94mm/px · z∈[-37,+105]mm · 6 of 144 slices shown (2 of 6)]
[im 1/144]
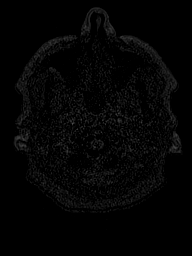
[im 29/144]
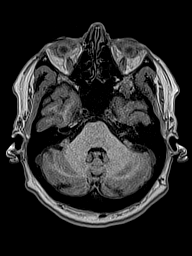
[im 58/144]
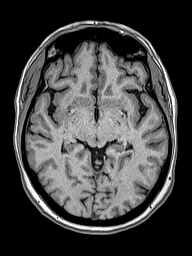
[im 86/144]
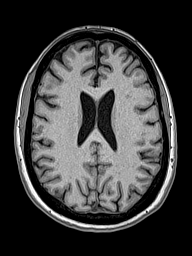
[im 115/144]
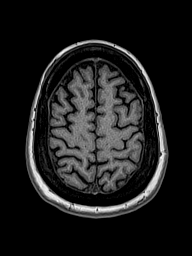
[im 144/144]
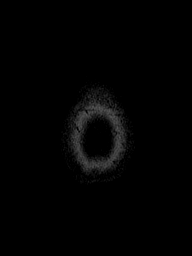

[Series 13: bSSFP · axial · 0.6mm · 0.56mm/px · z∈[-45,-12]mm · 2 of 56 slices shown]
[im 1/56]
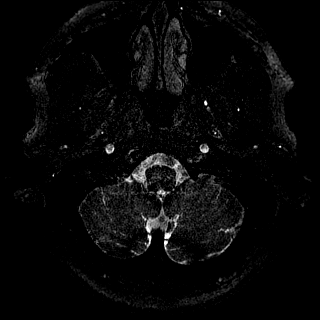
[im 56/56]
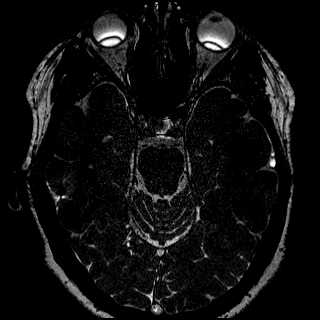

[Series 14: T1 · axial · 3.0mm · 0.31mm/px · 1 of 15 slices shown (3 of 6)]
[im 1/15]
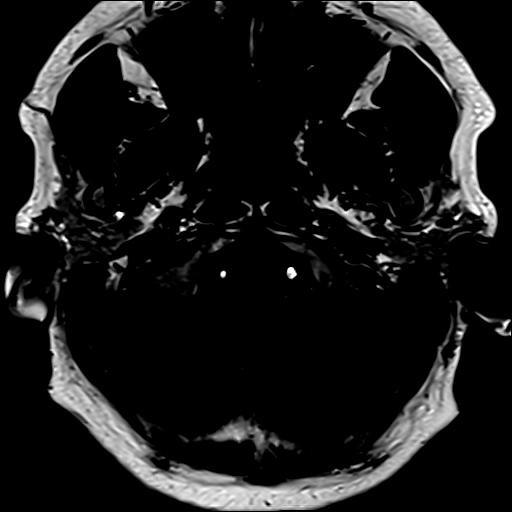

[Series 15: T1 · coronal · 3.0mm · 0.50mm/px · 1 of 15 slices shown (4 of 6)]
[im 1/15]
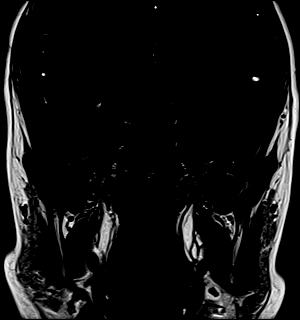

[Series 16: DWI · coronal · 5.0mm · 1.31mm/px · 3 of 64 slices shown (3 of 4)]
[im 1/64]
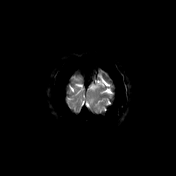
[im 32/64]
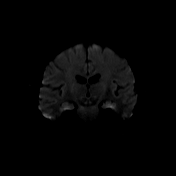
[im 64/64]
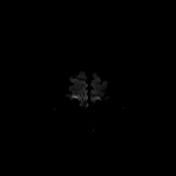

[Series 17: DWI · coronal · 5.0mm · 1.31mm/px · 1 of 32 slices shown (4 of 4)]
[im 1/32]
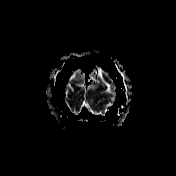

[Series 18: T2 post-contrast · coronal · 5.0mm · 0.57mm/px · 1 of 24 slices shown]
[im 1/24]
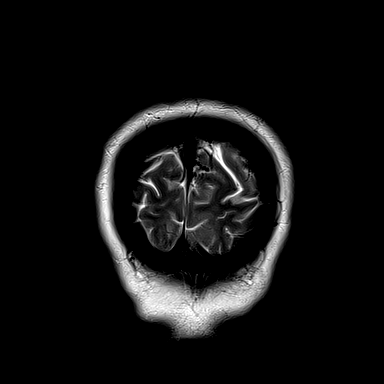

[Series 19: T1 post-contrast · axial · 1.0mm · 0.94mm/px · z∈[-37,+105]mm · 6 of 144 slices shown (1 of 5)]
[im 1/144]
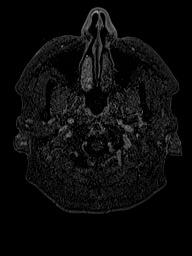
[im 29/144]
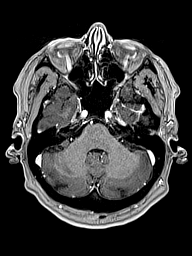
[im 58/144]
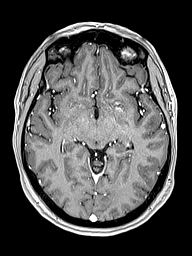
[im 86/144]
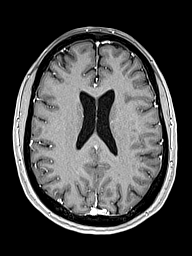
[im 115/144]
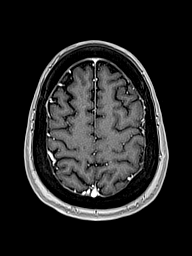
[im 144/144]
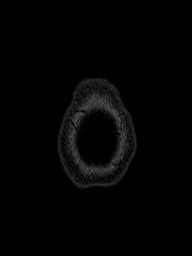

[Series 20: T1 · sagittal · 4.0mm · 0.94mm/px · 1 of 30 slices shown (5 of 6)]
[im 1/30]
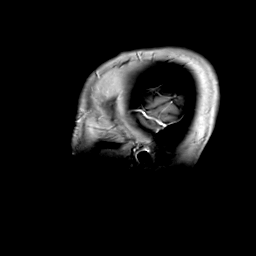

[Series 21: T1 · coronal · 4.0mm · 0.94mm/px · 1 of 30 slices shown (6 of 6)]
[im 1/30]
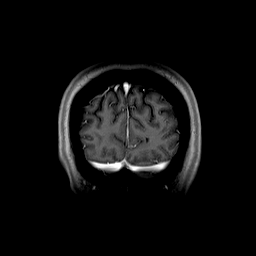

[Series 23: T1 post-contrast · axial · 3.0mm · 0.31mm/px · 1 of 15 slices shown (2 of 5)]
[im 1/15]
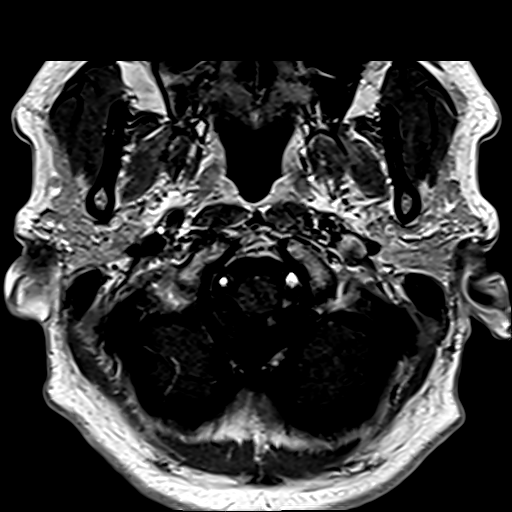

[Series 24: T1 post-contrast · coronal · 3.0mm · 0.62mm/px · 1 of 15 slices shown (3 of 5)]
[im 1/15]
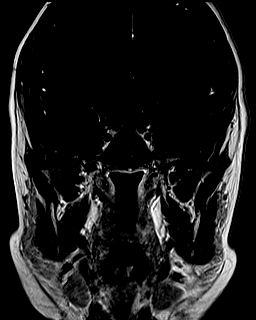

[Series 25: T1 post-contrast · coronal · 5.0mm · 0.43mm/px · 1 of 24 slices shown (4 of 5)]
[im 1/24]
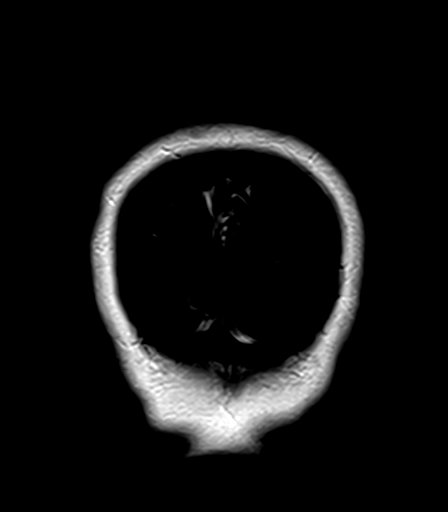

[Series 26: T1 post-contrast · sagittal · 5.0mm · 0.75mm/px · 1 of 24 slices shown (5 of 5)]
[im 1/24]
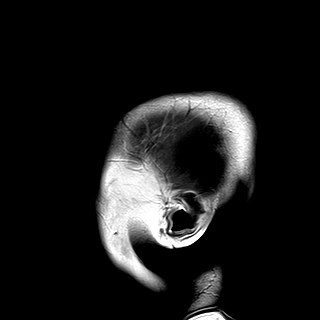

[41 of 48 positions shown; findings below may reference images not displayed]

FINDINGS: Brain: Scattered subcortical T2 hyperintensities bilaterally are
mildly advanced for age. No acute infarct, hemorrhage, or mass
lesion is present. The ventricles are of normal size. No significant
extraaxial fluid collection is present. The brainstem and cerebellum
are within normal limits.

Dedicated imaging of the internal auditory canals demonstrates a
discrete appearance of the seventh and eighth cranial nerves. No
mass lesion is present. No pathologic enhancement is present.

Postcontrast imaging through the remainder the brain is unremarkable
as well.

Vascular: Flow is present in the major intracranial arteries.

Skull and upper cervical spine: The craniocervical junction is
normal. Upper cervical spine is within normal limits. Marrow signal
is unremarkable.

Sinuses/Orbits: The paranasal sinuses and mastoid air cells are
clear. The globes and orbits are within normal limits.
IMPRESSION: 1. No acute or focal abnormality to explain the patient's symptoms.
2. Scattered subcortical T2 hyperintensities bilaterally are mildly
advanced for age. The finding is nonspecific but can be seen in the
setting of chronic microvascular ischemia, a demyelinating process
such as multiple sclerosis, vasculitis, complicated migraine
headaches, or as the sequelae of a prior infectious or inflammatory
process.

## 2020-03-16 MED ORDER — GADOBUTROL 1 MMOL/ML IV SOLN
6.0000 mL | Freq: Once | INTRAVENOUS | Status: AC | PRN
  Administered 2020-03-16: 6 mL via INTRAVENOUS

## 2020-03-23 DIAGNOSIS — M9903 Segmental and somatic dysfunction of lumbar region: Secondary | ICD-10-CM | POA: Diagnosis not present

## 2020-03-23 DIAGNOSIS — M50321 Other cervical disc degeneration at C4-C5 level: Secondary | ICD-10-CM | POA: Diagnosis not present

## 2020-03-23 DIAGNOSIS — M9902 Segmental and somatic dysfunction of thoracic region: Secondary | ICD-10-CM | POA: Diagnosis not present

## 2020-03-23 DIAGNOSIS — M50121 Cervical disc disorder at C4-C5 level with radiculopathy: Secondary | ICD-10-CM | POA: Diagnosis not present

## 2020-03-23 DIAGNOSIS — M9901 Segmental and somatic dysfunction of cervical region: Secondary | ICD-10-CM | POA: Diagnosis not present

## 2020-03-26 ENCOUNTER — Other Ambulatory Visit (HOSPITAL_COMMUNITY): Payer: Self-pay | Admitting: Family Medicine

## 2020-04-04 DIAGNOSIS — Z20822 Contact with and (suspected) exposure to covid-19: Secondary | ICD-10-CM | POA: Diagnosis not present

## 2020-04-07 DIAGNOSIS — E119 Type 2 diabetes mellitus without complications: Secondary | ICD-10-CM | POA: Diagnosis not present

## 2020-04-07 DIAGNOSIS — Z7984 Long term (current) use of oral hypoglycemic drugs: Secondary | ICD-10-CM | POA: Diagnosis not present

## 2020-04-07 DIAGNOSIS — R112 Nausea with vomiting, unspecified: Secondary | ICD-10-CM | POA: Diagnosis not present

## 2020-04-07 DIAGNOSIS — R42 Dizziness and giddiness: Secondary | ICD-10-CM | POA: Diagnosis not present

## 2020-04-20 DIAGNOSIS — M9902 Segmental and somatic dysfunction of thoracic region: Secondary | ICD-10-CM | POA: Diagnosis not present

## 2020-04-20 DIAGNOSIS — M50321 Other cervical disc degeneration at C4-C5 level: Secondary | ICD-10-CM | POA: Diagnosis not present

## 2020-04-20 DIAGNOSIS — M50121 Cervical disc disorder at C4-C5 level with radiculopathy: Secondary | ICD-10-CM | POA: Diagnosis not present

## 2020-04-20 DIAGNOSIS — M9903 Segmental and somatic dysfunction of lumbar region: Secondary | ICD-10-CM | POA: Diagnosis not present

## 2020-04-20 DIAGNOSIS — H524 Presbyopia: Secondary | ICD-10-CM | POA: Diagnosis not present

## 2020-04-20 DIAGNOSIS — M9901 Segmental and somatic dysfunction of cervical region: Secondary | ICD-10-CM | POA: Diagnosis not present

## 2020-04-27 DIAGNOSIS — Z6827 Body mass index (BMI) 27.0-27.9, adult: Secondary | ICD-10-CM | POA: Diagnosis not present

## 2020-04-27 DIAGNOSIS — R55 Syncope and collapse: Secondary | ICD-10-CM | POA: Diagnosis not present

## 2020-05-18 DIAGNOSIS — M50321 Other cervical disc degeneration at C4-C5 level: Secondary | ICD-10-CM | POA: Diagnosis not present

## 2020-05-18 DIAGNOSIS — M9903 Segmental and somatic dysfunction of lumbar region: Secondary | ICD-10-CM | POA: Diagnosis not present

## 2020-05-18 DIAGNOSIS — M9902 Segmental and somatic dysfunction of thoracic region: Secondary | ICD-10-CM | POA: Diagnosis not present

## 2020-05-18 DIAGNOSIS — M50121 Cervical disc disorder at C4-C5 level with radiculopathy: Secondary | ICD-10-CM | POA: Diagnosis not present

## 2020-05-18 DIAGNOSIS — M9901 Segmental and somatic dysfunction of cervical region: Secondary | ICD-10-CM | POA: Diagnosis not present

## 2020-06-01 DIAGNOSIS — E782 Mixed hyperlipidemia: Secondary | ICD-10-CM | POA: Diagnosis not present

## 2020-06-01 DIAGNOSIS — G479 Sleep disorder, unspecified: Secondary | ICD-10-CM | POA: Diagnosis not present

## 2020-06-01 DIAGNOSIS — F325 Major depressive disorder, single episode, in full remission: Secondary | ICD-10-CM | POA: Diagnosis not present

## 2020-06-01 DIAGNOSIS — Z6827 Body mass index (BMI) 27.0-27.9, adult: Secondary | ICD-10-CM | POA: Diagnosis not present

## 2020-06-01 DIAGNOSIS — I10 Essential (primary) hypertension: Secondary | ICD-10-CM | POA: Diagnosis not present

## 2020-06-03 ENCOUNTER — Other Ambulatory Visit (HOSPITAL_COMMUNITY): Payer: Self-pay | Admitting: Internal Medicine

## 2020-06-03 DIAGNOSIS — I10 Essential (primary) hypertension: Secondary | ICD-10-CM | POA: Diagnosis not present

## 2020-06-03 DIAGNOSIS — Z6826 Body mass index (BMI) 26.0-26.9, adult: Secondary | ICD-10-CM | POA: Diagnosis not present

## 2020-06-03 DIAGNOSIS — E1165 Type 2 diabetes mellitus with hyperglycemia: Secondary | ICD-10-CM | POA: Diagnosis not present

## 2020-06-03 DIAGNOSIS — E785 Hyperlipidemia, unspecified: Secondary | ICD-10-CM | POA: Diagnosis not present

## 2020-06-08 DIAGNOSIS — M9902 Segmental and somatic dysfunction of thoracic region: Secondary | ICD-10-CM | POA: Diagnosis not present

## 2020-06-08 DIAGNOSIS — M9903 Segmental and somatic dysfunction of lumbar region: Secondary | ICD-10-CM | POA: Diagnosis not present

## 2020-06-08 DIAGNOSIS — M50321 Other cervical disc degeneration at C4-C5 level: Secondary | ICD-10-CM | POA: Diagnosis not present

## 2020-06-08 DIAGNOSIS — M50121 Cervical disc disorder at C4-C5 level with radiculopathy: Secondary | ICD-10-CM | POA: Diagnosis not present

## 2020-06-08 DIAGNOSIS — M9901 Segmental and somatic dysfunction of cervical region: Secondary | ICD-10-CM | POA: Diagnosis not present

## 2020-06-29 ENCOUNTER — Other Ambulatory Visit (HOSPITAL_COMMUNITY): Payer: Self-pay | Admitting: Internal Medicine

## 2020-07-06 DIAGNOSIS — M9901 Segmental and somatic dysfunction of cervical region: Secondary | ICD-10-CM | POA: Diagnosis not present

## 2020-07-06 DIAGNOSIS — M50121 Cervical disc disorder at C4-C5 level with radiculopathy: Secondary | ICD-10-CM | POA: Diagnosis not present

## 2020-07-06 DIAGNOSIS — M9902 Segmental and somatic dysfunction of thoracic region: Secondary | ICD-10-CM | POA: Diagnosis not present

## 2020-07-06 DIAGNOSIS — M9903 Segmental and somatic dysfunction of lumbar region: Secondary | ICD-10-CM | POA: Diagnosis not present

## 2020-07-06 DIAGNOSIS — M50321 Other cervical disc degeneration at C4-C5 level: Secondary | ICD-10-CM | POA: Diagnosis not present

## 2020-07-09 ENCOUNTER — Other Ambulatory Visit (HOSPITAL_COMMUNITY): Payer: Self-pay | Admitting: Family Medicine

## 2020-07-13 ENCOUNTER — Other Ambulatory Visit (HOSPITAL_COMMUNITY): Payer: Self-pay | Admitting: Family Medicine

## 2020-08-03 DIAGNOSIS — M50321 Other cervical disc degeneration at C4-C5 level: Secondary | ICD-10-CM | POA: Diagnosis not present

## 2020-08-03 DIAGNOSIS — M50121 Cervical disc disorder at C4-C5 level with radiculopathy: Secondary | ICD-10-CM | POA: Diagnosis not present

## 2020-08-03 DIAGNOSIS — M9903 Segmental and somatic dysfunction of lumbar region: Secondary | ICD-10-CM | POA: Diagnosis not present

## 2020-08-03 DIAGNOSIS — M9902 Segmental and somatic dysfunction of thoracic region: Secondary | ICD-10-CM | POA: Diagnosis not present

## 2020-08-03 DIAGNOSIS — M9901 Segmental and somatic dysfunction of cervical region: Secondary | ICD-10-CM | POA: Diagnosis not present

## 2020-08-11 ENCOUNTER — Other Ambulatory Visit: Payer: Self-pay | Admitting: Family Medicine

## 2020-08-11 DIAGNOSIS — Z1231 Encounter for screening mammogram for malignant neoplasm of breast: Secondary | ICD-10-CM

## 2020-08-31 ENCOUNTER — Other Ambulatory Visit (HOSPITAL_COMMUNITY): Payer: Self-pay | Admitting: Internal Medicine

## 2020-08-31 DIAGNOSIS — M9903 Segmental and somatic dysfunction of lumbar region: Secondary | ICD-10-CM | POA: Diagnosis not present

## 2020-08-31 DIAGNOSIS — M50121 Cervical disc disorder at C4-C5 level with radiculopathy: Secondary | ICD-10-CM | POA: Diagnosis not present

## 2020-08-31 DIAGNOSIS — M9901 Segmental and somatic dysfunction of cervical region: Secondary | ICD-10-CM | POA: Diagnosis not present

## 2020-08-31 DIAGNOSIS — M50321 Other cervical disc degeneration at C4-C5 level: Secondary | ICD-10-CM | POA: Diagnosis not present

## 2020-08-31 DIAGNOSIS — M9902 Segmental and somatic dysfunction of thoracic region: Secondary | ICD-10-CM | POA: Diagnosis not present

## 2020-09-03 ENCOUNTER — Other Ambulatory Visit: Payer: Self-pay | Admitting: Oncology

## 2020-09-03 DIAGNOSIS — Z0000 Encounter for general adult medical examination without abnormal findings: Secondary | ICD-10-CM

## 2020-09-21 DIAGNOSIS — M9902 Segmental and somatic dysfunction of thoracic region: Secondary | ICD-10-CM | POA: Diagnosis not present

## 2020-09-21 DIAGNOSIS — M50321 Other cervical disc degeneration at C4-C5 level: Secondary | ICD-10-CM | POA: Diagnosis not present

## 2020-09-21 DIAGNOSIS — M9901 Segmental and somatic dysfunction of cervical region: Secondary | ICD-10-CM | POA: Diagnosis not present

## 2020-09-21 DIAGNOSIS — M9903 Segmental and somatic dysfunction of lumbar region: Secondary | ICD-10-CM | POA: Diagnosis not present

## 2020-09-21 DIAGNOSIS — M50122 Cervical disc disorder at C5-C6 level with radiculopathy: Secondary | ICD-10-CM | POA: Diagnosis not present

## 2020-09-23 ENCOUNTER — Other Ambulatory Visit (HOSPITAL_BASED_OUTPATIENT_CLINIC_OR_DEPARTMENT_OTHER): Payer: Self-pay

## 2020-09-28 DIAGNOSIS — M50321 Other cervical disc degeneration at C4-C5 level: Secondary | ICD-10-CM | POA: Diagnosis not present

## 2020-09-28 DIAGNOSIS — M9902 Segmental and somatic dysfunction of thoracic region: Secondary | ICD-10-CM | POA: Diagnosis not present

## 2020-09-28 DIAGNOSIS — M9901 Segmental and somatic dysfunction of cervical region: Secondary | ICD-10-CM | POA: Diagnosis not present

## 2020-09-28 DIAGNOSIS — M50121 Cervical disc disorder at C4-C5 level with radiculopathy: Secondary | ICD-10-CM | POA: Diagnosis not present

## 2020-09-28 DIAGNOSIS — M9903 Segmental and somatic dysfunction of lumbar region: Secondary | ICD-10-CM | POA: Diagnosis not present

## 2020-09-30 ENCOUNTER — Other Ambulatory Visit: Payer: Self-pay

## 2020-09-30 ENCOUNTER — Ambulatory Visit
Admission: RE | Admit: 2020-09-30 | Discharge: 2020-09-30 | Disposition: A | Discharge status: Home / Self Care | Payer: 59 | Source: Ambulatory Visit | Attending: Family Medicine | Admitting: Family Medicine

## 2020-09-30 DIAGNOSIS — Z1231 Encounter for screening mammogram for malignant neoplasm of breast: Secondary | ICD-10-CM | POA: Diagnosis not present

## 2020-09-30 IMAGING — MG MM DIGITAL SCREENING BILAT W/ TOMO AND CAD
8 series · 8 of 24 positions shown · non-contrast
Comparison: Previous exam(s).

CLINICAL DATA: Screening.

EXAM:
DIGITAL SCREENING BILATERAL MAMMOGRAM WITH TOMOSYNTHESIS AND CAD
TECHNIQUE: Bilateral screening digital craniocaudal and mediolateral oblique
mammograms were obtained. Bilateral screening digital breast
tomosynthesis was performed. The images were evaluated with
computer-aided detection.

[L MLO synth-2D]
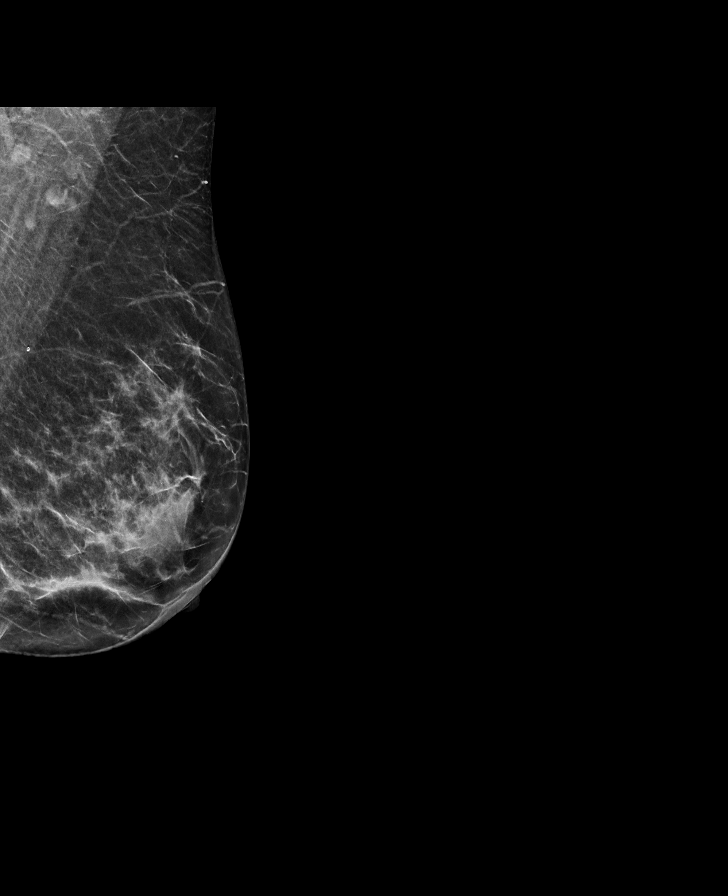

[R MLO synth-2D]
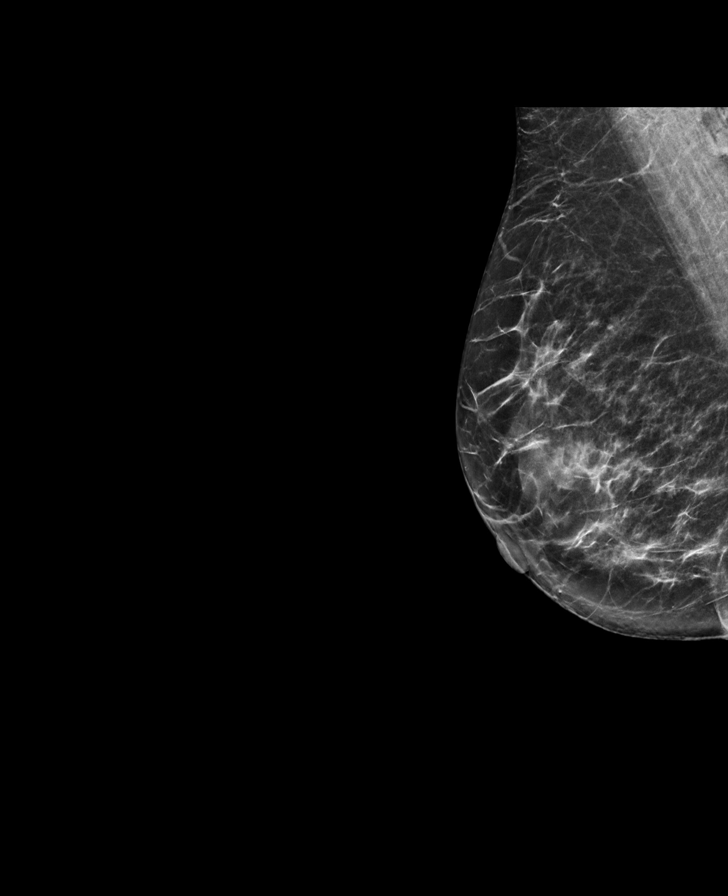

[R CC synth-2D]
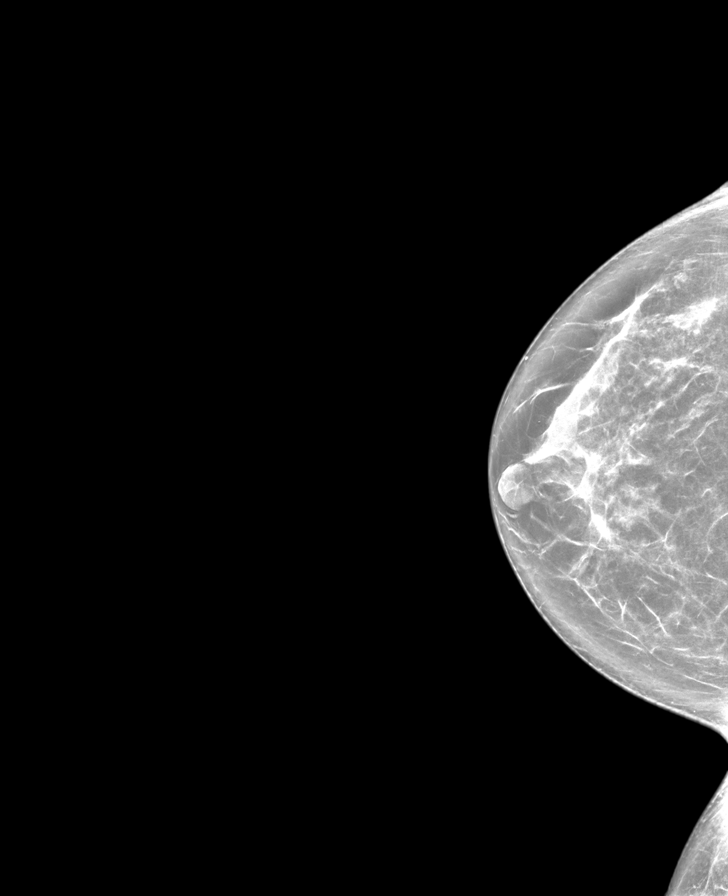

[L CC synth-2D]
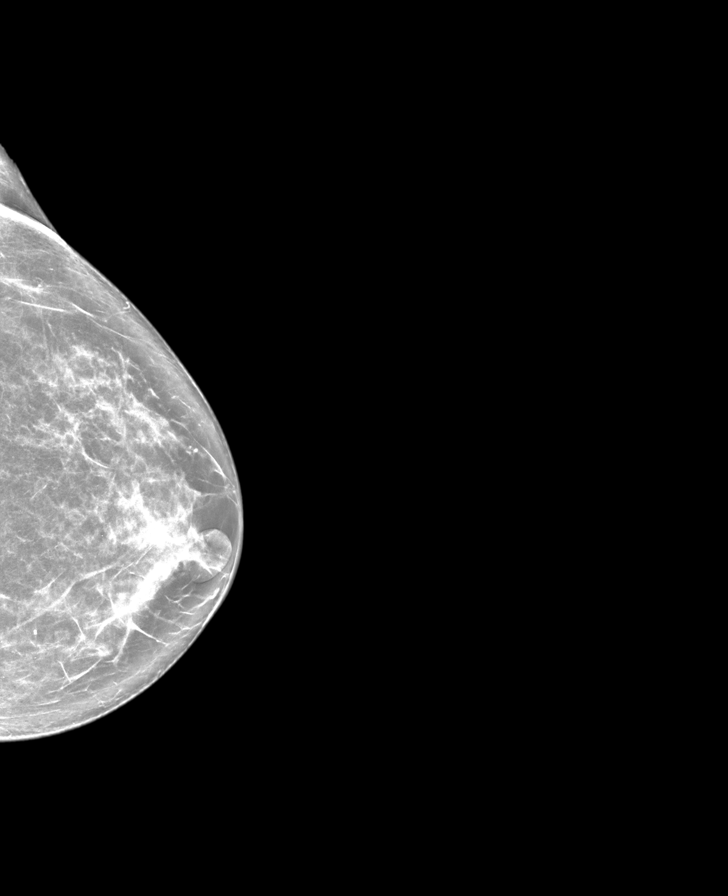

[L CC tomo · tomo slice 35/70.0]
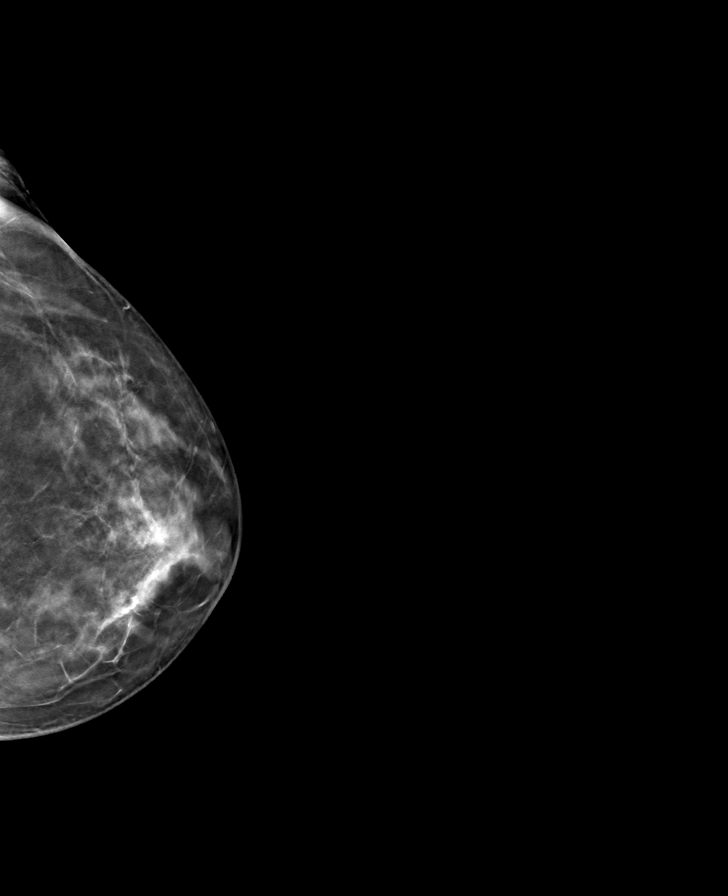

[L MLO tomo · tomo slice 35/68.0]
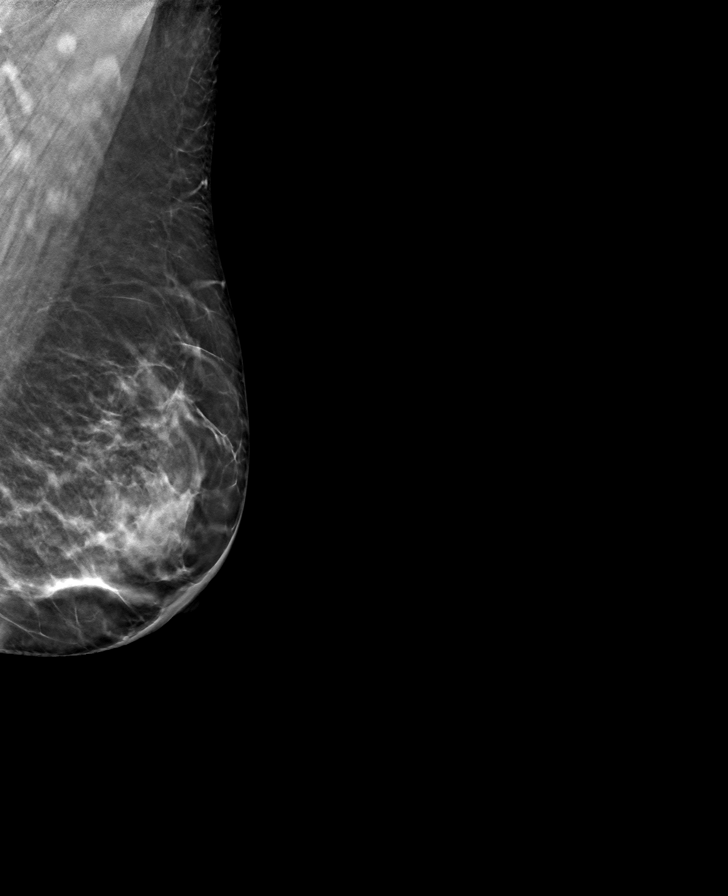

[R CC tomo · tomo slice 37/72.0]
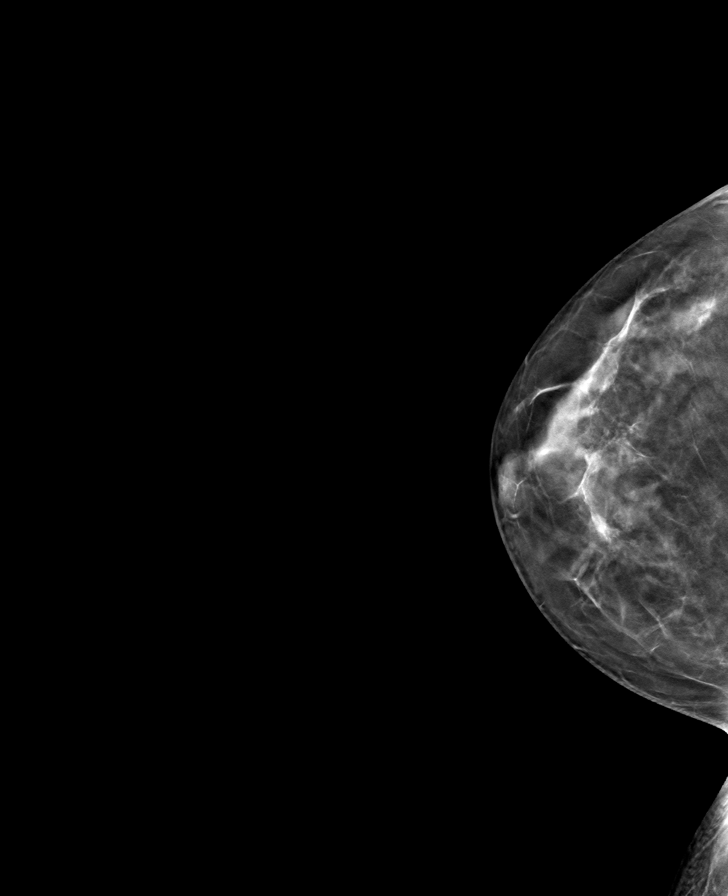

[R MLO tomo · tomo slice 35/68.0]
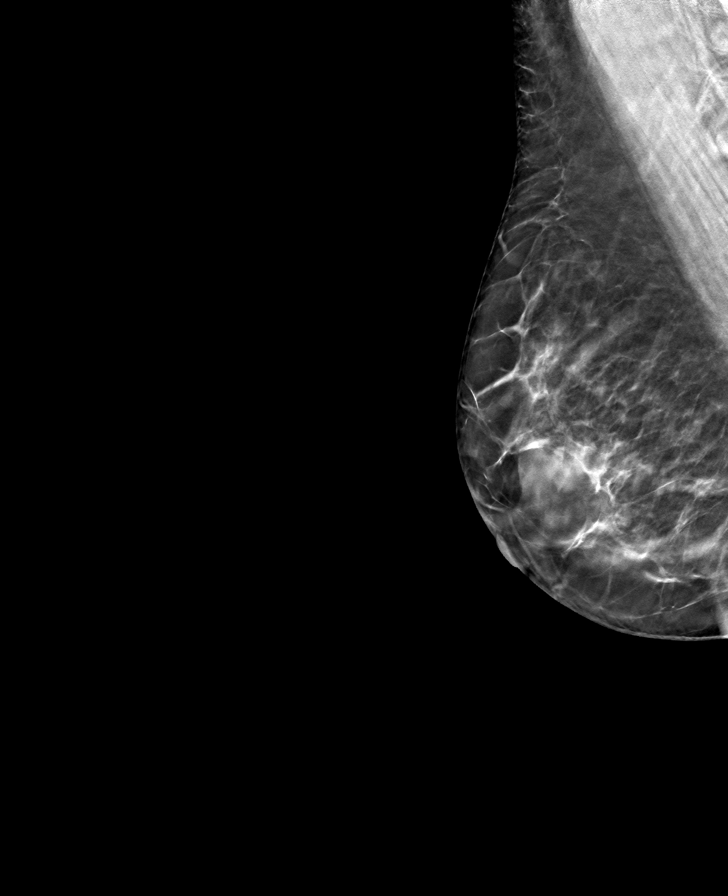

[8 of 24 positions shown; findings below may reference images not displayed]

ACR Breast Density Category b: There are scattered areas of
fibroglandular density.
FINDINGS: There are no findings suspicious for malignancy. The images were
evaluated with computer-aided detection.
IMPRESSION: No mammographic evidence of malignancy. A result letter of this
screening mammogram will be mailed directly to the patient.

RECOMMENDATION:
Screening mammogram in one year. (Code:[OD])

BI-RADS CATEGORY  1: Negative.

## 2020-10-06 ENCOUNTER — Other Ambulatory Visit (HOSPITAL_COMMUNITY): Payer: Self-pay

## 2020-10-06 MED ORDER — LISINOPRIL 5 MG PO TABS
ORAL_TABLET | ORAL | 0 refills | Status: DC
  Filled 2020-10-06: qty 90, 90d supply, fill #0

## 2020-10-06 MED FILL — Citalopram Hydrobromide Tab 20 MG (Base Equiv): ORAL | 90 days supply | Qty: 90 | Fill #0 | Status: AC

## 2020-10-06 MED FILL — Atorvastatin Calcium Tab 40 MG (Base Equivalent): ORAL | 90 days supply | Qty: 90 | Fill #0 | Status: AC

## 2020-10-07 ENCOUNTER — Other Ambulatory Visit (HOSPITAL_COMMUNITY): Payer: Self-pay

## 2020-10-07 MED ORDER — ATOVAQUONE-PROGUANIL HCL 250-100 MG PO TABS
ORAL_TABLET | ORAL | 0 refills | Status: DC
  Filled 2020-10-07: qty 22, 22d supply, fill #0

## 2020-10-07 MED ORDER — AZITHROMYCIN 500 MG PO TABS
ORAL_TABLET | ORAL | 0 refills | Status: DC
  Filled 2020-10-07: qty 4, 3d supply, fill #0

## 2020-10-08 ENCOUNTER — Other Ambulatory Visit (HOSPITAL_COMMUNITY): Payer: Self-pay

## 2020-10-09 ENCOUNTER — Other Ambulatory Visit (HOSPITAL_COMMUNITY): Payer: Self-pay

## 2020-10-09 ENCOUNTER — Other Ambulatory Visit: Payer: Self-pay

## 2020-10-09 ENCOUNTER — Ambulatory Visit: Payer: 59 | Attending: Internal Medicine

## 2020-10-09 DIAGNOSIS — Z23 Encounter for immunization: Secondary | ICD-10-CM

## 2020-10-09 NOTE — Progress Notes (Signed)
   Covid-19 Vaccination Clinic  Name:  Victoria Mooney    MRN: [id number] DOB: 11-Sep-1955  10/09/2020  [name] was observed post Covid-19 immunization for 15 minutes without incident. She was provided with Vaccine Information Sheet and instruction to access the V-Safe system.   [name] was instructed to call 911 with any severe reactions post vaccine: Marland Kitchen Difficulty breathing  . Swelling of face and throat  . A fast heartbeat  . A bad rash all over body  . Dizziness and weakness   Immunizations Administered    Name Date Dose VIS Date Route   Moderna Covid-19 Booster Vaccine 10/09/2020  9:30 AM 0.25 mL 04/22/2020 Intramuscular   Manufacturer: Levan Hurst   Lot: [id number]   [employer]: [id number]

## 2020-10-15 ENCOUNTER — Other Ambulatory Visit (HOSPITAL_BASED_OUTPATIENT_CLINIC_OR_DEPARTMENT_OTHER): Payer: Self-pay

## 2020-10-15 ENCOUNTER — Other Ambulatory Visit (HOSPITAL_COMMUNITY): Payer: Self-pay

## 2020-10-15 MED ORDER — COVID-19 MRNA VACC (MODERNA) 100 MCG/0.5ML IM SUSP
INTRAMUSCULAR | 0 refills | Status: DC
  Filled 2020-10-15: qty 0.25, 1d supply, fill #0

## 2020-10-16 ENCOUNTER — Other Ambulatory Visit (HOSPITAL_BASED_OUTPATIENT_CLINIC_OR_DEPARTMENT_OTHER): Payer: Self-pay

## 2020-10-29 ENCOUNTER — Other Ambulatory Visit (HOSPITAL_COMMUNITY): Payer: Self-pay

## 2020-11-01 MED FILL — Continuous Glucose System Sensor: 28 days supply | Qty: 2 | Fill #0 | Status: AC

## 2020-11-02 ENCOUNTER — Other Ambulatory Visit (HOSPITAL_COMMUNITY): Payer: Self-pay

## 2020-11-03 ENCOUNTER — Other Ambulatory Visit (HOSPITAL_COMMUNITY): Payer: Self-pay

## 2020-11-23 ENCOUNTER — Other Ambulatory Visit (HOSPITAL_BASED_OUTPATIENT_CLINIC_OR_DEPARTMENT_OTHER): Payer: Self-pay

## 2020-12-01 ENCOUNTER — Other Ambulatory Visit (HOSPITAL_BASED_OUTPATIENT_CLINIC_OR_DEPARTMENT_OTHER): Payer: Self-pay

## 2020-12-01 DIAGNOSIS — Z23 Encounter for immunization: Secondary | ICD-10-CM | POA: Diagnosis not present

## 2020-12-01 DIAGNOSIS — F325 Major depressive disorder, single episode, in full remission: Secondary | ICD-10-CM | POA: Diagnosis not present

## 2020-12-01 DIAGNOSIS — E1169 Type 2 diabetes mellitus with other specified complication: Secondary | ICD-10-CM | POA: Diagnosis not present

## 2020-12-01 DIAGNOSIS — Z0000 Encounter for general adult medical examination without abnormal findings: Secondary | ICD-10-CM | POA: Diagnosis not present

## 2020-12-01 DIAGNOSIS — E782 Mixed hyperlipidemia: Secondary | ICD-10-CM | POA: Diagnosis not present

## 2020-12-01 DIAGNOSIS — Z6825 Body mass index (BMI) 25.0-25.9, adult: Secondary | ICD-10-CM | POA: Diagnosis not present

## 2020-12-01 DIAGNOSIS — E663 Overweight: Secondary | ICD-10-CM | POA: Diagnosis not present

## 2020-12-01 DIAGNOSIS — Z86010 Personal history of colonic polyps: Secondary | ICD-10-CM | POA: Diagnosis not present

## 2020-12-01 DIAGNOSIS — G479 Sleep disorder, unspecified: Secondary | ICD-10-CM | POA: Diagnosis not present

## 2020-12-01 MED ORDER — CITALOPRAM HYDROBROMIDE 40 MG PO TABS
ORAL_TABLET | ORAL | 3 refills | Status: DC
  Filled 2020-12-01: qty 90, 90d supply, fill #0
  Filled 2021-05-21: qty 90, 90d supply, fill #1
  Filled 2021-08-27: qty 90, 90d supply, fill #2

## 2020-12-02 ENCOUNTER — Other Ambulatory Visit (HOSPITAL_BASED_OUTPATIENT_CLINIC_OR_DEPARTMENT_OTHER): Payer: Self-pay

## 2020-12-02 ENCOUNTER — Other Ambulatory Visit (HOSPITAL_COMMUNITY): Payer: Self-pay

## 2020-12-02 DIAGNOSIS — Z6826 Body mass index (BMI) 26.0-26.9, adult: Secondary | ICD-10-CM | POA: Diagnosis not present

## 2020-12-02 DIAGNOSIS — E1165 Type 2 diabetes mellitus with hyperglycemia: Secondary | ICD-10-CM | POA: Diagnosis not present

## 2020-12-02 DIAGNOSIS — I10 Essential (primary) hypertension: Secondary | ICD-10-CM | POA: Diagnosis not present

## 2020-12-02 DIAGNOSIS — E785 Hyperlipidemia, unspecified: Secondary | ICD-10-CM | POA: Diagnosis not present

## 2020-12-02 MED ORDER — GLIPIZIDE ER 2.5 MG PO TB24
ORAL_TABLET | ORAL | 0 refills | Status: DC
  Filled 2020-12-02: qty 90, 30d supply, fill #0

## 2020-12-02 MED ORDER — TRULICITY 0.75 MG/0.5ML ~~LOC~~ SOAJ
SUBCUTANEOUS | 2 refills | Status: DC
  Filled 2020-12-02 – 2021-01-20 (×2): qty 2, 28d supply, fill #0
  Filled 2021-05-24: qty 2, 28d supply, fill #1

## 2020-12-02 MED ORDER — FREESTYLE LIBRE 2 SENSOR MISC
6 refills | Status: DC
  Filled 2020-12-02: qty 2, 30d supply, fill #0
  Filled 2021-01-05 – 2021-02-15 (×3): qty 2, 28d supply, fill #0
  Filled 2021-03-27: qty 2, 28d supply, fill #1
  Filled 2021-04-24: qty 2, 28d supply, fill #2
  Filled 2021-05-21: qty 2, 28d supply, fill #3
  Filled 2021-06-18: qty 2, 28d supply, fill #4

## 2020-12-02 MED FILL — Metformin HCl Tab ER 24HR 500 MG: ORAL | 90 days supply | Qty: 360 | Fill #0 | Status: AC

## 2020-12-02 MED FILL — Continuous Glucose System Sensor: 28 days supply | Qty: 2 | Fill #0 | Status: AC

## 2020-12-03 ENCOUNTER — Other Ambulatory Visit (HOSPITAL_COMMUNITY): Payer: Self-pay

## 2020-12-03 ENCOUNTER — Other Ambulatory Visit (HOSPITAL_BASED_OUTPATIENT_CLINIC_OR_DEPARTMENT_OTHER): Payer: Self-pay

## 2020-12-03 MED ORDER — GLIPIZIDE ER 2.5 MG PO TB24
ORAL_TABLET | ORAL | 1 refills | Status: DC
  Filled 2020-12-03: qty 270, 30d supply, fill #0

## 2020-12-03 MED ORDER — LISINOPRIL 5 MG PO TABS
ORAL_TABLET | ORAL | 1 refills | Status: DC
  Filled 2020-12-03 – 2020-12-09 (×2): qty 90, 90d supply, fill #0
  Filled 2021-02-22: qty 90, 90d supply, fill #1

## 2020-12-04 ENCOUNTER — Other Ambulatory Visit (HOSPITAL_BASED_OUTPATIENT_CLINIC_OR_DEPARTMENT_OTHER): Payer: Self-pay

## 2020-12-09 ENCOUNTER — Other Ambulatory Visit (HOSPITAL_BASED_OUTPATIENT_CLINIC_OR_DEPARTMENT_OTHER): Payer: Self-pay

## 2021-01-05 ENCOUNTER — Other Ambulatory Visit (HOSPITAL_BASED_OUTPATIENT_CLINIC_OR_DEPARTMENT_OTHER): Payer: Self-pay

## 2021-01-05 MED FILL — Continuous Glucose System Sensor: 28 days supply | Qty: 2 | Fill #1 | Status: CN

## 2021-01-06 ENCOUNTER — Other Ambulatory Visit (HOSPITAL_BASED_OUTPATIENT_CLINIC_OR_DEPARTMENT_OTHER): Payer: Self-pay

## 2021-01-11 ENCOUNTER — Other Ambulatory Visit (HOSPITAL_COMMUNITY): Payer: Self-pay

## 2021-01-14 ENCOUNTER — Other Ambulatory Visit (HOSPITAL_BASED_OUTPATIENT_CLINIC_OR_DEPARTMENT_OTHER): Payer: Self-pay

## 2021-01-14 MED ORDER — ATORVASTATIN CALCIUM 40 MG PO TABS
40.0000 mg | ORAL_TABLET | Freq: Every day | ORAL | 3 refills | Status: DC
  Filled 2021-01-14: qty 90, 90d supply, fill #0
  Filled 2021-04-21: qty 90, 90d supply, fill #1
  Filled 2021-07-27: qty 90, 90d supply, fill #2
  Filled 2021-10-25: qty 90, 90d supply, fill #3

## 2021-01-20 ENCOUNTER — Other Ambulatory Visit (HOSPITAL_BASED_OUTPATIENT_CLINIC_OR_DEPARTMENT_OTHER): Payer: Self-pay

## 2021-01-28 ENCOUNTER — Other Ambulatory Visit (HOSPITAL_BASED_OUTPATIENT_CLINIC_OR_DEPARTMENT_OTHER): Payer: Self-pay

## 2021-02-15 ENCOUNTER — Other Ambulatory Visit (HOSPITAL_BASED_OUTPATIENT_CLINIC_OR_DEPARTMENT_OTHER): Payer: Self-pay

## 2021-02-22 ENCOUNTER — Other Ambulatory Visit (HOSPITAL_BASED_OUTPATIENT_CLINIC_OR_DEPARTMENT_OTHER): Payer: Self-pay

## 2021-02-22 MED FILL — Metformin HCl Tab ER 24HR 500 MG: ORAL | 90 days supply | Qty: 360 | Fill #1 | Status: AC

## 2021-02-26 ENCOUNTER — Other Ambulatory Visit (HOSPITAL_BASED_OUTPATIENT_CLINIC_OR_DEPARTMENT_OTHER): Payer: Self-pay

## 2021-02-26 MED ORDER — TRULICITY 0.75 MG/0.5ML ~~LOC~~ SOAJ
SUBCUTANEOUS | 2 refills | Status: DC
  Filled 2021-02-26: qty 2, 28d supply, fill #0
  Filled 2021-03-27: qty 2, 28d supply, fill #1
  Filled 2021-04-23: qty 2, 28d supply, fill #2

## 2021-03-04 ENCOUNTER — Other Ambulatory Visit (HOSPITAL_BASED_OUTPATIENT_CLINIC_OR_DEPARTMENT_OTHER): Payer: Self-pay

## 2021-03-04 DIAGNOSIS — E785 Hyperlipidemia, unspecified: Secondary | ICD-10-CM | POA: Diagnosis not present

## 2021-03-04 DIAGNOSIS — Z6826 Body mass index (BMI) 26.0-26.9, adult: Secondary | ICD-10-CM | POA: Diagnosis not present

## 2021-03-04 DIAGNOSIS — E1165 Type 2 diabetes mellitus with hyperglycemia: Secondary | ICD-10-CM | POA: Diagnosis not present

## 2021-03-04 DIAGNOSIS — I10 Essential (primary) hypertension: Secondary | ICD-10-CM | POA: Diagnosis not present

## 2021-03-04 MED ORDER — METFORMIN HCL ER 500 MG PO TB24
ORAL_TABLET | ORAL | 1 refills | Status: DC
  Filled 2021-03-04: qty 360, 90d supply, fill #0
  Filled 2022-02-10: qty 360, fill #0

## 2021-03-04 MED ORDER — TRULICITY 0.75 MG/0.5ML ~~LOC~~ SOAJ
SUBCUTANEOUS | 1 refills | Status: DC
  Filled 2021-05-21: qty 6, fill #0
  Filled 2021-05-24: qty 6, 84d supply, fill #0
  Filled 2021-08-27: qty 6, 84d supply, fill #1

## 2021-03-05 ENCOUNTER — Other Ambulatory Visit (HOSPITAL_BASED_OUTPATIENT_CLINIC_OR_DEPARTMENT_OTHER): Payer: Self-pay

## 2021-03-05 MED ORDER — CARESTART COVID-19 HOME TEST VI KIT
PACK | 0 refills | Status: DC
  Filled 2021-03-05: qty 2, 4d supply, fill #0

## 2021-03-08 DIAGNOSIS — J0100 Acute maxillary sinusitis, unspecified: Secondary | ICD-10-CM | POA: Diagnosis not present

## 2021-03-09 DIAGNOSIS — H25813 Combined forms of age-related cataract, bilateral: Secondary | ICD-10-CM | POA: Diagnosis not present

## 2021-03-09 DIAGNOSIS — E1136 Type 2 diabetes mellitus with diabetic cataract: Secondary | ICD-10-CM | POA: Diagnosis not present

## 2021-03-09 DIAGNOSIS — Z7984 Long term (current) use of oral hypoglycemic drugs: Secondary | ICD-10-CM | POA: Diagnosis not present

## 2021-03-09 DIAGNOSIS — H524 Presbyopia: Secondary | ICD-10-CM | POA: Diagnosis not present

## 2021-03-13 DIAGNOSIS — R051 Acute cough: Secondary | ICD-10-CM | POA: Diagnosis not present

## 2021-03-13 DIAGNOSIS — J209 Acute bronchitis, unspecified: Secondary | ICD-10-CM | POA: Diagnosis not present

## 2021-03-13 DIAGNOSIS — R0981 Nasal congestion: Secondary | ICD-10-CM | POA: Diagnosis not present

## 2021-03-29 ENCOUNTER — Other Ambulatory Visit (HOSPITAL_BASED_OUTPATIENT_CLINIC_OR_DEPARTMENT_OTHER): Payer: Self-pay

## 2021-04-19 ENCOUNTER — Other Ambulatory Visit (HOSPITAL_BASED_OUTPATIENT_CLINIC_OR_DEPARTMENT_OTHER): Payer: Self-pay

## 2021-04-19 ENCOUNTER — Ambulatory Visit: Payer: 59 | Attending: Internal Medicine

## 2021-04-19 ENCOUNTER — Other Ambulatory Visit: Payer: Self-pay

## 2021-04-19 DIAGNOSIS — Z23 Encounter for immunization: Secondary | ICD-10-CM

## 2021-04-19 MED ORDER — PFIZER COVID-19 VAC BIVALENT 30 MCG/0.3ML IM SUSP
INTRAMUSCULAR | 0 refills | Status: DC
  Filled 2021-04-19: qty 0.3, 1d supply, fill #0

## 2021-04-19 NOTE — Progress Notes (Signed)
   Covid-19 Vaccination Clinic  Name:  Victoria Mooney    MRN: [id number] DOB: May 23, 1956  04/19/2021  Victoria Mooney was observed post Covid-19 immunization for 15 minutes without incident. She was provided with Vaccine Information Sheet and instruction to access the V-Safe system.   Victoria Mooney was instructed to call 911 with any severe reactions post vaccine: Difficulty breathing  Swelling of face and throat  A fast heartbeat  A bad rash all over body  Dizziness and weakness

## 2021-04-22 ENCOUNTER — Other Ambulatory Visit (HOSPITAL_BASED_OUTPATIENT_CLINIC_OR_DEPARTMENT_OTHER): Payer: Self-pay

## 2021-04-23 ENCOUNTER — Other Ambulatory Visit (HOSPITAL_BASED_OUTPATIENT_CLINIC_OR_DEPARTMENT_OTHER): Payer: Self-pay

## 2021-04-26 ENCOUNTER — Other Ambulatory Visit (HOSPITAL_BASED_OUTPATIENT_CLINIC_OR_DEPARTMENT_OTHER): Payer: Self-pay

## 2021-05-10 ENCOUNTER — Other Ambulatory Visit (HOSPITAL_BASED_OUTPATIENT_CLINIC_OR_DEPARTMENT_OTHER): Payer: Self-pay

## 2021-05-10 DIAGNOSIS — F325 Major depressive disorder, single episode, in full remission: Secondary | ICD-10-CM | POA: Diagnosis not present

## 2021-05-10 DIAGNOSIS — E663 Overweight: Secondary | ICD-10-CM | POA: Diagnosis not present

## 2021-05-10 DIAGNOSIS — E782 Mixed hyperlipidemia: Secondary | ICD-10-CM | POA: Diagnosis not present

## 2021-05-10 DIAGNOSIS — G479 Sleep disorder, unspecified: Secondary | ICD-10-CM | POA: Diagnosis not present

## 2021-05-10 MED ORDER — ZOLPIDEM TARTRATE 10 MG PO TABS
ORAL_TABLET | ORAL | 0 refills | Status: DC
  Filled 2021-05-10 – 2021-05-31 (×2): qty 30, 30d supply, fill #0

## 2021-05-21 ENCOUNTER — Other Ambulatory Visit (HOSPITAL_BASED_OUTPATIENT_CLINIC_OR_DEPARTMENT_OTHER): Payer: Self-pay

## 2021-05-24 ENCOUNTER — Other Ambulatory Visit (HOSPITAL_BASED_OUTPATIENT_CLINIC_OR_DEPARTMENT_OTHER): Payer: Self-pay

## 2021-05-31 ENCOUNTER — Other Ambulatory Visit (HOSPITAL_BASED_OUTPATIENT_CLINIC_OR_DEPARTMENT_OTHER): Payer: Self-pay

## 2021-06-18 ENCOUNTER — Other Ambulatory Visit (HOSPITAL_BASED_OUTPATIENT_CLINIC_OR_DEPARTMENT_OTHER): Payer: Self-pay

## 2021-06-18 MED FILL — Metformin HCl Tab ER 24HR 500 MG: ORAL | 90 days supply | Qty: 360 | Fill #0 | Status: AC

## 2021-06-22 ENCOUNTER — Other Ambulatory Visit (HOSPITAL_BASED_OUTPATIENT_CLINIC_OR_DEPARTMENT_OTHER): Payer: Self-pay

## 2021-06-22 MED ORDER — LISINOPRIL 5 MG PO TABS
ORAL_TABLET | ORAL | 2 refills | Status: DC
  Filled 2021-06-22: qty 90, 90d supply, fill #0
  Filled 2021-09-27: qty 90, 90d supply, fill #1
  Filled 2021-12-15: qty 90, 90d supply, fill #2

## 2021-06-30 ENCOUNTER — Other Ambulatory Visit: Payer: Self-pay | Admitting: Internal Medicine

## 2021-06-30 DIAGNOSIS — Z006 Encounter for examination for normal comparison and control in clinical research program: Secondary | ICD-10-CM

## 2021-07-10 ENCOUNTER — Other Ambulatory Visit (HOSPITAL_BASED_OUTPATIENT_CLINIC_OR_DEPARTMENT_OTHER): Payer: Self-pay

## 2021-07-12 ENCOUNTER — Other Ambulatory Visit (HOSPITAL_BASED_OUTPATIENT_CLINIC_OR_DEPARTMENT_OTHER): Payer: Self-pay

## 2021-07-13 ENCOUNTER — Encounter (HOSPITAL_BASED_OUTPATIENT_CLINIC_OR_DEPARTMENT_OTHER): Payer: Self-pay | Admitting: Pharmacist

## 2021-07-13 ENCOUNTER — Other Ambulatory Visit (HOSPITAL_BASED_OUTPATIENT_CLINIC_OR_DEPARTMENT_OTHER): Payer: Self-pay

## 2021-07-14 ENCOUNTER — Other Ambulatory Visit (HOSPITAL_BASED_OUTPATIENT_CLINIC_OR_DEPARTMENT_OTHER): Payer: Self-pay

## 2021-07-16 ENCOUNTER — Other Ambulatory Visit (HOSPITAL_BASED_OUTPATIENT_CLINIC_OR_DEPARTMENT_OTHER): Payer: Self-pay

## 2021-07-16 ENCOUNTER — Other Ambulatory Visit: Payer: Self-pay

## 2021-07-16 ENCOUNTER — Ambulatory Visit
Admission: RE | Admit: 2021-07-16 | Discharge: 2021-07-16 | Disposition: A | Discharge status: Home / Self Care | Payer: Self-pay | Source: Ambulatory Visit | Attending: Internal Medicine | Admitting: Internal Medicine

## 2021-07-16 DIAGNOSIS — Z006 Encounter for examination for normal comparison and control in clinical research program: Secondary | ICD-10-CM

## 2021-07-16 IMAGING — MR MR HEAD W/O CM
7 series · 48 of 48 positions shown · non-contrast
Comparison: Brain MRI [DATE].

CLINICAL DATA: Provided history: Exam for clinical research.

EXAM:
MRI HEAD WITHOUT CONTRAST
TECHNIQUE: Multiplanar, multiecho pulse sequences of the brain and surrounding
structures were obtained without intravenous contrast.

[Series 3: 3dt1 sag · sagittal · 1.2mm · 1.25mm/px · 15 of 128 slices shown]
[im 1/128]
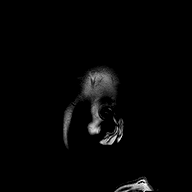
[im 10/128]
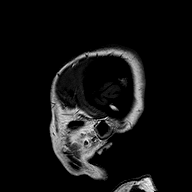
[im 19/128]
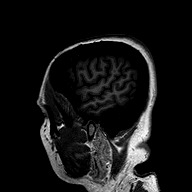
[im 28/128]
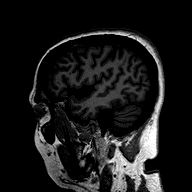
[im 37/128]
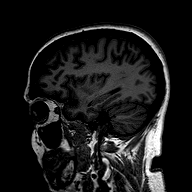
[im 46/128]
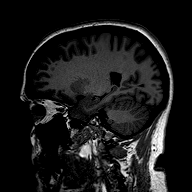
[im 55/128]
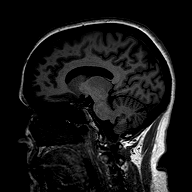
[im 64/128]
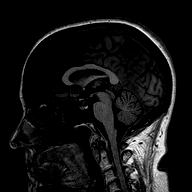
[im 73/128]
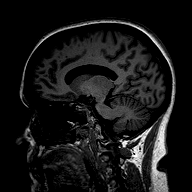
[im 82/128]
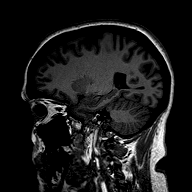
[im 91/128]
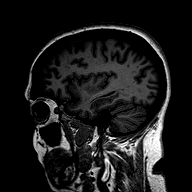
[im 100/128]
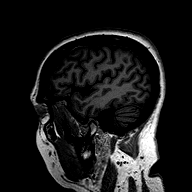
[im 109/128]
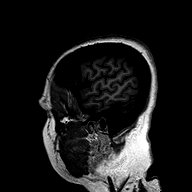
[im 118/128]
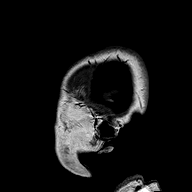
[im 128/128]
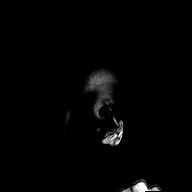

[Series 4: FLAIR · axial · 5.0mm · 0.94mm/px · z∈[-60,+94]mm · 4 of 29 slices shown]
[im 1/29]
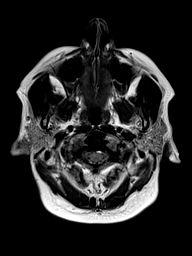
[im 10/29]
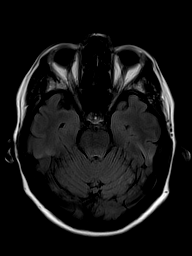
[im 19/29]
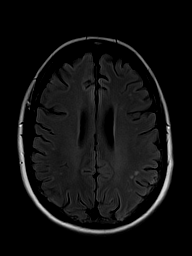
[im 29/29]
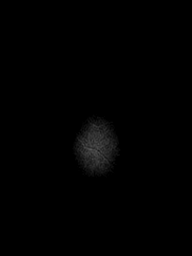

[Series 5: T2-star · axial · 5.0mm · 0.94mm/px · z∈[-84,+80]mm · 3 of 31 slices shown]
[im 1/31]
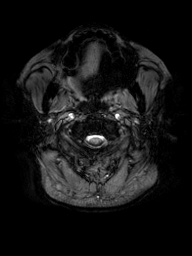
[im 16/31]
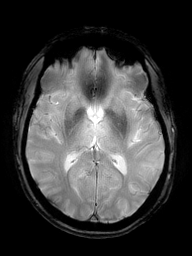
[im 31/31]
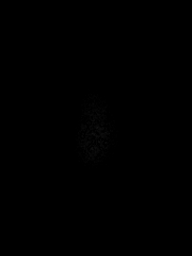

[Series 6: T2 · axial · 5.0mm · 0.94mm/px · z∈[-78,+74]mm · 3 of 29 slices shown]
[im 1/29]
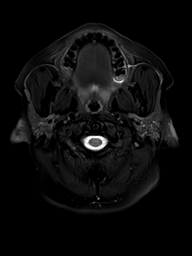
[im 15/29]
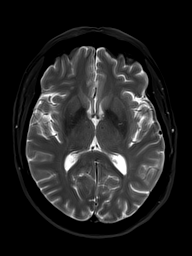
[im 29/29]
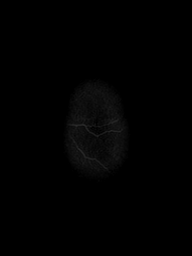

[Series 7: DWI · axial · 5.0mm · 0.94mm/px · z∈[-81,+77]mm · 13 of 120 slices shown]
[im 1/120]
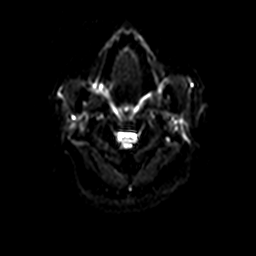
[im 10/120]
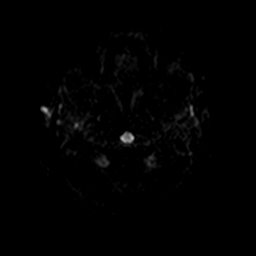
[im 20/120]
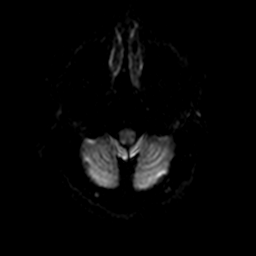
[im 30/120]
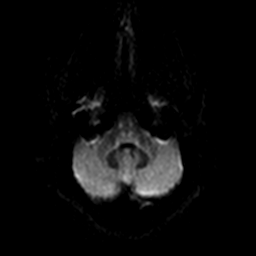
[im 40/120]
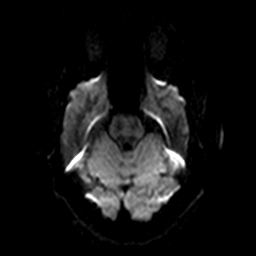
[im 50/120]
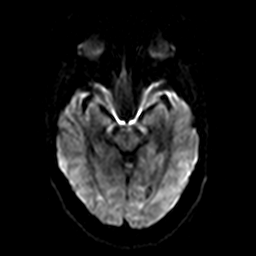
[im 60/120]
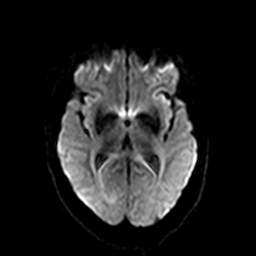
[im 70/120]
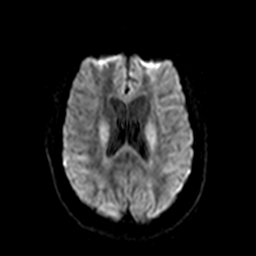
[im 80/120]
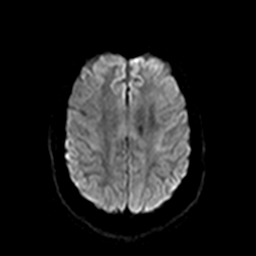
[im 90/120]
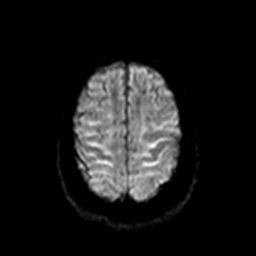
[im 100/120]
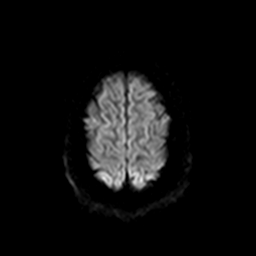
[im 110/120]
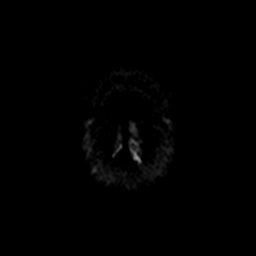
[im 120/120]
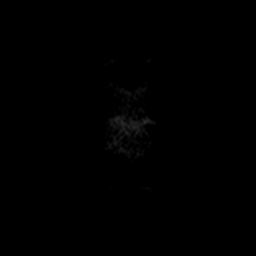

[Series 8: ax dwi_tracew · axial · 5.0mm · 0.94mm/px · z∈[-81,+77]mm · 7 of 60 slices shown]
[im 1/60]
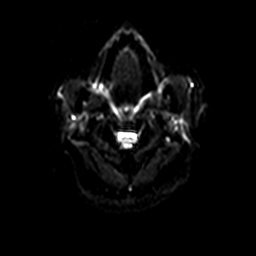
[im 10/60]
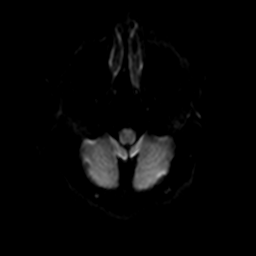
[im 20/60]
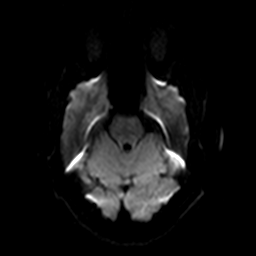
[im 30/60]
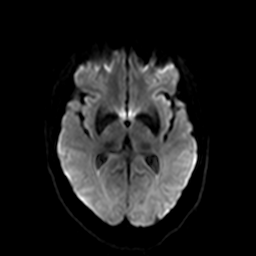
[im 40/60]
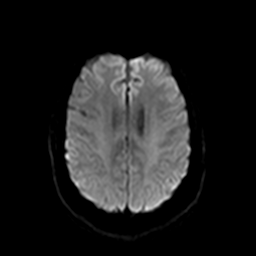
[im 50/60]
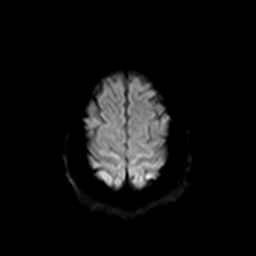
[im 60/60]
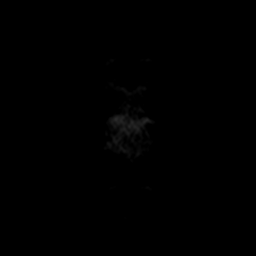

[Series 9: ax dwi_adc · axial · 5.0mm · 0.94mm/px · z∈[-81,+77]mm · 3 of 30 slices shown]
[im 1/30]
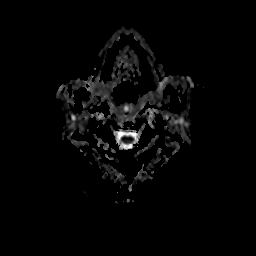
[im 15/30]
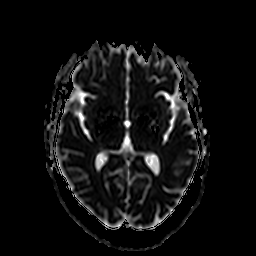
[im 30/30]
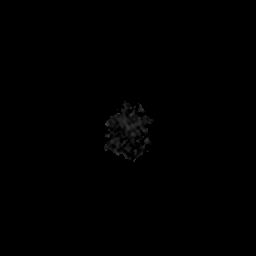

[48 of 48 positions shown; findings below may reference images not displayed]

FINDINGS: Brain:

A research protocol noncontrast brain MRI was performed. The
following sequences were acquired: Sagittal T1, axial T2 FLAIR,
axial T2 GRE, axial T2 TSE and axial diffusion-weighted.

Cerebral volume appears normal for age.

Mild multifocal T2 FLAIR hyperintense signal abnormality within the
cerebral white matter, nonspecific but likely reflecting chronic
small vessel ischemic disease given the patient's history of
diabetes and hyperlipidemia.

There is no acute infarct.

No evidence of an intracranial mass.

No chronic intracranial blood products.

No extra-axial fluid collection.

No midline shift.

Vascular: Maintained flow voids within the proximal large arterial
vessels.

Skull and upper cervical spine: No focal suspicious marrow lesion.

Sinuses/Orbits: Visualized orbits show no acute finding. No
significant paranasal sinus disease.
IMPRESSION: Research protocol brain MRI.

No evidence of acute intracranial abnormality.

Mild multifocal T2 FLAIR hyperintense signal abnormality within the
cerebral white matter, nonspecific but likely reflecting chronic
small vessel ischemic disease given the history of diabetes and
hyperlipidemia.

## 2021-07-16 MED ORDER — FREESTYLE LIBRE 2 SENSOR MISC
6 refills | Status: DC
  Filled 2021-07-16: qty 2, 28d supply, fill #0
  Filled 2021-07-30: qty 2, 28d supply, fill #1
  Filled 2021-08-27: qty 2, 28d supply, fill #2
  Filled 2021-10-08: qty 2, 28d supply, fill #3
  Filled 2021-10-09 – 2021-11-01 (×2): qty 2, 28d supply, fill #0
  Filled 2021-11-17: qty 2, 28d supply, fill #1
  Filled 2021-12-10: qty 2, 28d supply, fill #2

## 2021-07-28 ENCOUNTER — Other Ambulatory Visit (HOSPITAL_BASED_OUTPATIENT_CLINIC_OR_DEPARTMENT_OTHER): Payer: Self-pay

## 2021-07-30 ENCOUNTER — Other Ambulatory Visit (HOSPITAL_BASED_OUTPATIENT_CLINIC_OR_DEPARTMENT_OTHER): Payer: Self-pay

## 2021-08-19 ENCOUNTER — Other Ambulatory Visit (HOSPITAL_BASED_OUTPATIENT_CLINIC_OR_DEPARTMENT_OTHER): Payer: Self-pay

## 2021-08-30 ENCOUNTER — Other Ambulatory Visit: Payer: Self-pay | Admitting: Family Medicine

## 2021-08-30 ENCOUNTER — Other Ambulatory Visit (HOSPITAL_BASED_OUTPATIENT_CLINIC_OR_DEPARTMENT_OTHER): Payer: Self-pay

## 2021-08-30 DIAGNOSIS — Z1231 Encounter for screening mammogram for malignant neoplasm of breast: Secondary | ICD-10-CM

## 2021-09-14 ENCOUNTER — Other Ambulatory Visit (HOSPITAL_BASED_OUTPATIENT_CLINIC_OR_DEPARTMENT_OTHER): Payer: Self-pay

## 2021-09-14 MED ORDER — METFORMIN HCL ER 500 MG PO TB24
ORAL_TABLET | ORAL | 1 refills | Status: DC
  Filled 2021-09-14: qty 360, 90d supply, fill #0
  Filled 2022-03-12: qty 360, 90d supply, fill #1

## 2021-09-14 MED ORDER — TRULICITY 0.75 MG/0.5ML ~~LOC~~ SOAJ
SUBCUTANEOUS | 1 refills | Status: DC
  Filled 2021-09-14 – 2021-11-17 (×2): qty 6, 84d supply, fill #0
  Filled 2022-02-10: qty 6, 84d supply, fill #1

## 2021-09-24 ENCOUNTER — Other Ambulatory Visit (HOSPITAL_BASED_OUTPATIENT_CLINIC_OR_DEPARTMENT_OTHER): Payer: Self-pay

## 2021-09-24 MED ORDER — MECLIZINE HCL 25 MG PO TABS
ORAL_TABLET | ORAL | 0 refills | Status: DC
  Filled 2021-09-24: qty 28, 14d supply, fill #0

## 2021-09-28 ENCOUNTER — Other Ambulatory Visit (HOSPITAL_BASED_OUTPATIENT_CLINIC_OR_DEPARTMENT_OTHER): Payer: Self-pay

## 2021-10-01 ENCOUNTER — Ambulatory Visit
Admission: RE | Admit: 2021-10-01 | Discharge: 2021-10-01 | Disposition: A | Discharge status: Home / Self Care | Payer: No Typology Code available for payment source | Source: Ambulatory Visit | Attending: Family Medicine | Admitting: Family Medicine

## 2021-10-01 DIAGNOSIS — Z1231 Encounter for screening mammogram for malignant neoplasm of breast: Secondary | ICD-10-CM

## 2021-10-01 IMAGING — MG MM DIGITAL SCREENING BILAT W/ TOMO AND CAD
8 series · 9 of 24 positions shown · non-contrast
Comparison: Previous exam(s).

CLINICAL DATA: Screening.

EXAM:
DIGITAL SCREENING BILATERAL MAMMOGRAM WITH TOMOSYNTHESIS AND CAD
TECHNIQUE: Bilateral screening digital craniocaudal and mediolateral oblique
mammograms were obtained. Bilateral screening digital breast
tomosynthesis was performed. The images were evaluated with
computer-aided detection.

[R CC synth-2D]
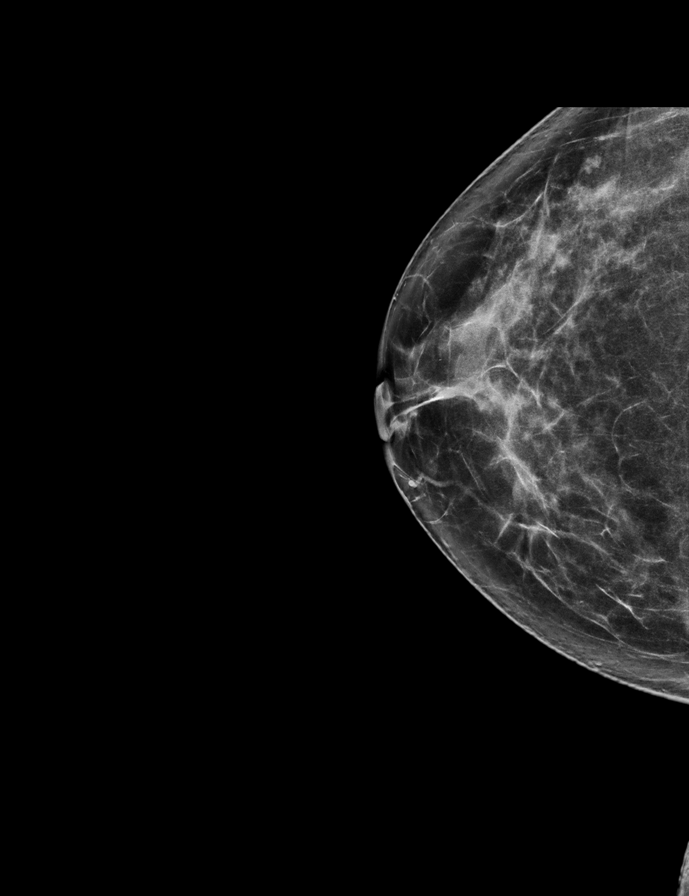

[L CC synth-2D]
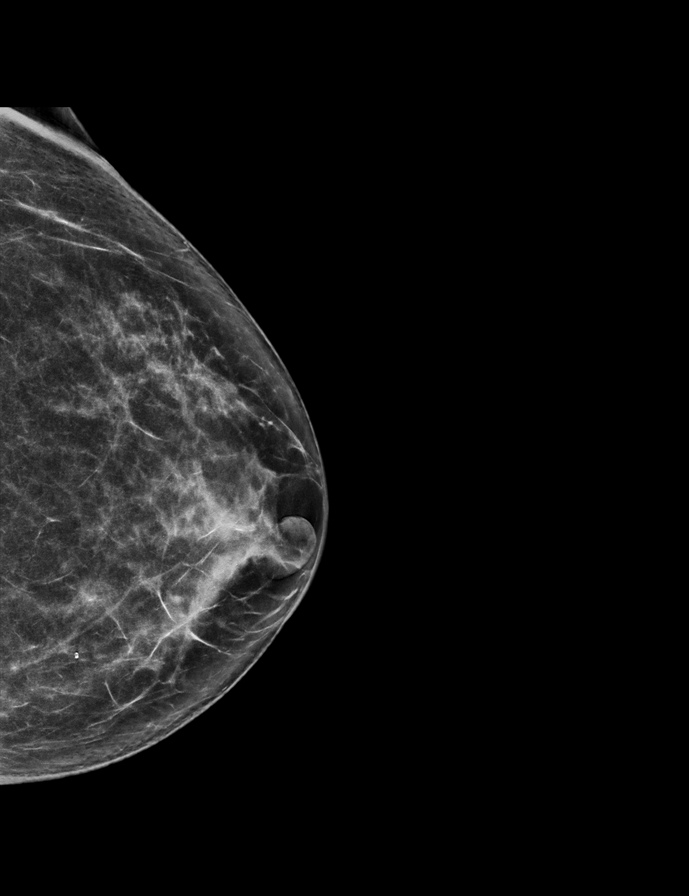

[R MLO synth-2D]
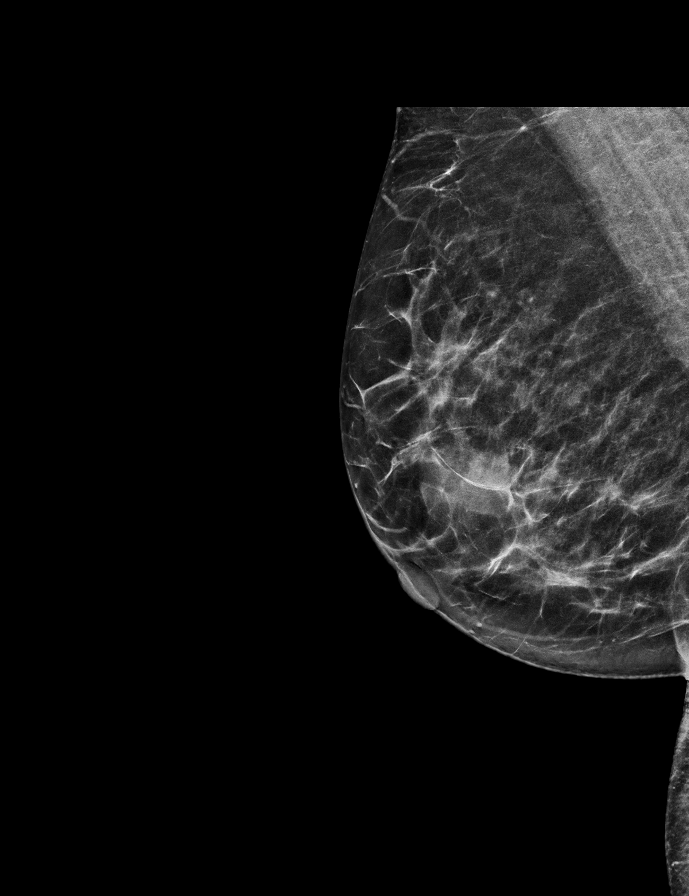

[L MLO synth-2D]
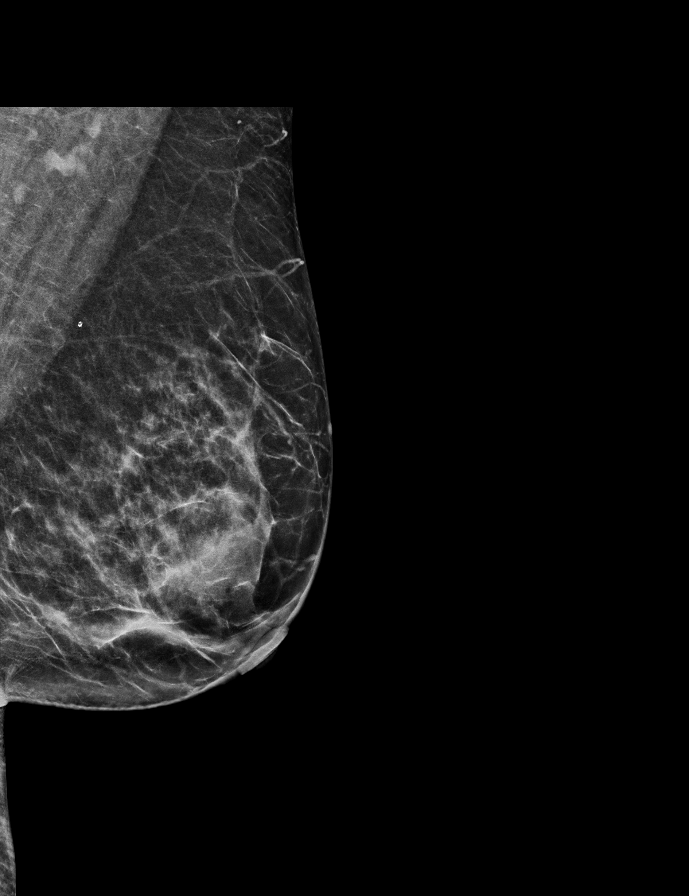

[R CC tomo · 2 of 65 frames shown]
[frame 21/65]
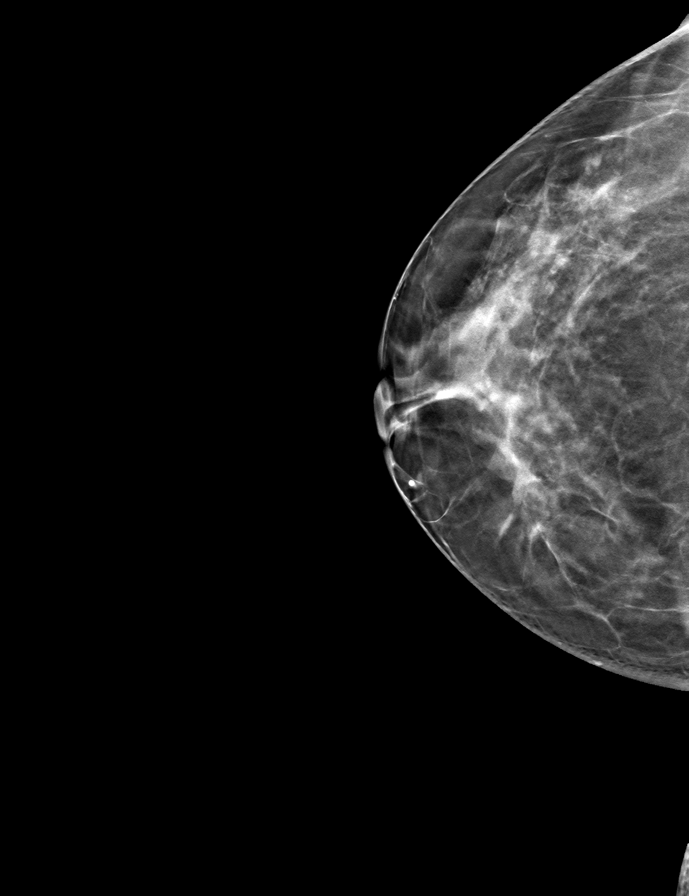
[frame 33/65]
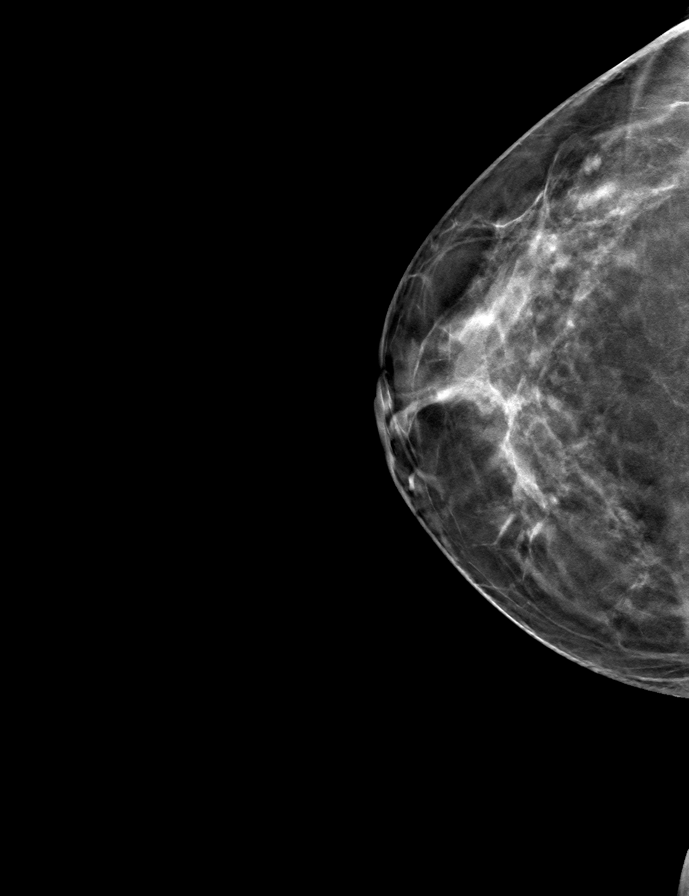

[L MLO tomo · tomo slice 31/61.0]
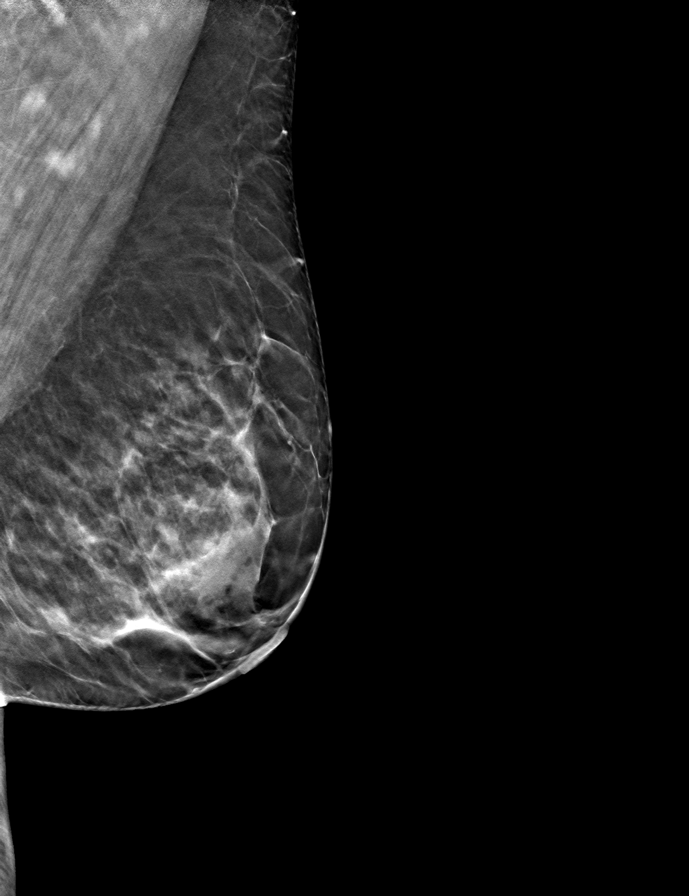

[L CC tomo · tomo slice 33/64.0]
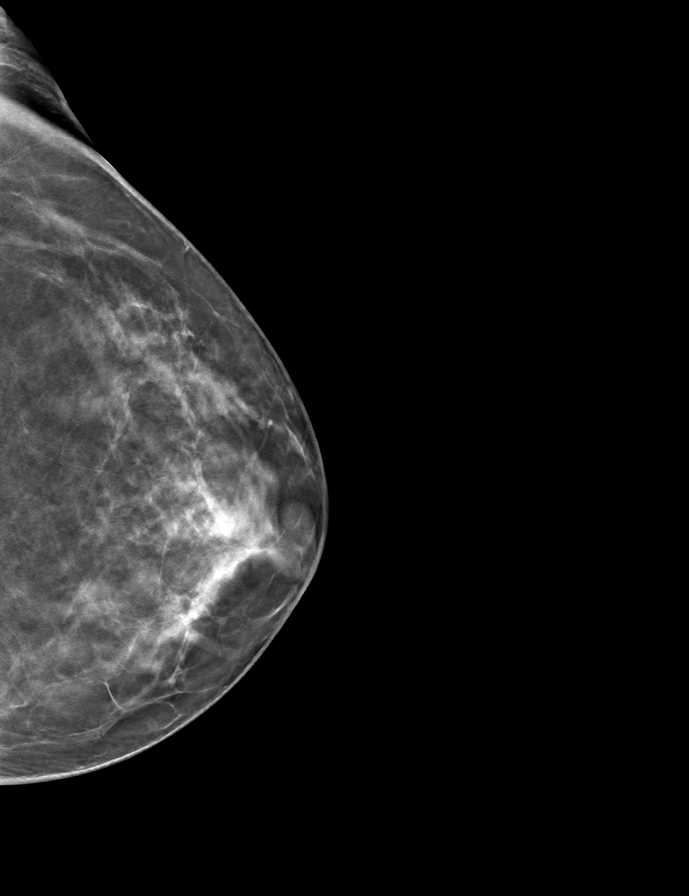

[R MLO tomo · tomo slice 31/61.0]
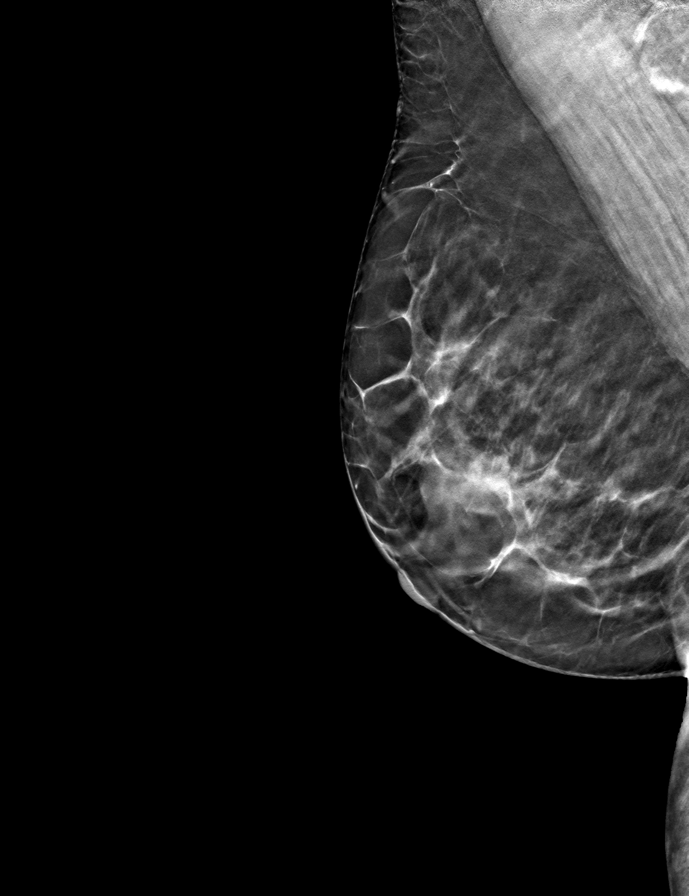

[9 of 24 positions shown; findings below may reference images not displayed]

ACR Breast Density Category c: The breast tissue is heterogeneously
dense, which may obscure small masses.
FINDINGS: There are no findings suspicious for malignancy.
IMPRESSION: No mammographic evidence of malignancy. A result letter of this
screening mammogram will be mailed directly to the patient.

RECOMMENDATION:
Screening mammogram in one year. (Code:[V2])

BI-RADS CATEGORY  1: Negative.

## 2021-10-09 ENCOUNTER — Other Ambulatory Visit (HOSPITAL_COMMUNITY): Payer: Self-pay

## 2021-10-12 ENCOUNTER — Other Ambulatory Visit (HOSPITAL_BASED_OUTPATIENT_CLINIC_OR_DEPARTMENT_OTHER): Payer: Self-pay

## 2021-10-25 ENCOUNTER — Other Ambulatory Visit (HOSPITAL_BASED_OUTPATIENT_CLINIC_OR_DEPARTMENT_OTHER): Payer: Self-pay

## 2021-11-01 ENCOUNTER — Other Ambulatory Visit (HOSPITAL_BASED_OUTPATIENT_CLINIC_OR_DEPARTMENT_OTHER): Payer: Self-pay

## 2021-11-17 ENCOUNTER — Other Ambulatory Visit (HOSPITAL_BASED_OUTPATIENT_CLINIC_OR_DEPARTMENT_OTHER): Payer: Self-pay

## 2021-11-19 ENCOUNTER — Ambulatory Visit: Payer: No Typology Code available for payment source | Attending: Internal Medicine

## 2021-11-19 ENCOUNTER — Other Ambulatory Visit (HOSPITAL_BASED_OUTPATIENT_CLINIC_OR_DEPARTMENT_OTHER): Payer: Self-pay

## 2021-11-19 DIAGNOSIS — Z23 Encounter for immunization: Secondary | ICD-10-CM

## 2021-11-19 MED ORDER — PFIZER COVID-19 VAC BIVALENT 30 MCG/0.3ML IM SUSP
INTRAMUSCULAR | 0 refills | Status: DC
  Filled 2021-11-19: qty 0.3, 1d supply, fill #0

## 2021-11-19 NOTE — Progress Notes (Signed)
   Covid-19 Vaccination Clinic  Name:  Victoria Mooney    MRN: [id number] DOB: 02-29-1956  11/19/2021  Victoria Mooney was observed post Covid-19 immunization for 15 minutes without incident. She was provided with Vaccine Information Sheet and instruction to access the V-Safe system.   Victoria Mooney was instructed to call 911 with any severe reactions post vaccine: Difficulty breathing  Swelling of face and throat  A fast heartbeat  A bad rash all over body  Dizziness and weakness   Immunizations Administered     Name Date Dose VIS Date Route   Pfizer Covid-19 Vaccine Bivalent Booster 11/19/2021  2:04 PM 0.3 mL 03/03/2021 Intramuscular   Manufacturer: [place]   Lot: [id number]   [place]: [phone]

## 2021-12-07 ENCOUNTER — Other Ambulatory Visit (HOSPITAL_BASED_OUTPATIENT_CLINIC_OR_DEPARTMENT_OTHER): Payer: Self-pay

## 2021-12-07 ENCOUNTER — Other Ambulatory Visit (HOSPITAL_BASED_OUTPATIENT_CLINIC_OR_DEPARTMENT_OTHER): Payer: Self-pay | Admitting: Family Medicine

## 2021-12-07 DIAGNOSIS — Z780 Asymptomatic menopausal state: Secondary | ICD-10-CM

## 2021-12-07 MED ORDER — ONDANSETRON HCL 4 MG PO TABS
ORAL_TABLET | ORAL | 0 refills | Status: DC
Start: 2021-12-07 — End: 2023-03-08
  Filled 2021-12-07: qty 30, 10d supply, fill #0

## 2021-12-07 MED ORDER — SCOPOLAMINE 1 MG/3DAYS TD PT72
MEDICATED_PATCH | TRANSDERMAL | 0 refills | Status: DC
  Filled 2021-12-07: qty 9, 30d supply, fill #0

## 2021-12-10 ENCOUNTER — Other Ambulatory Visit (HOSPITAL_BASED_OUTPATIENT_CLINIC_OR_DEPARTMENT_OTHER): Payer: Self-pay

## 2021-12-15 ENCOUNTER — Other Ambulatory Visit (HOSPITAL_BASED_OUTPATIENT_CLINIC_OR_DEPARTMENT_OTHER): Payer: Self-pay

## 2022-01-17 ENCOUNTER — Other Ambulatory Visit (HOSPITAL_BASED_OUTPATIENT_CLINIC_OR_DEPARTMENT_OTHER): Payer: Self-pay | Admitting: Pharmacist

## 2022-01-17 ENCOUNTER — Other Ambulatory Visit (HOSPITAL_BASED_OUTPATIENT_CLINIC_OR_DEPARTMENT_OTHER): Payer: Self-pay

## 2022-01-18 ENCOUNTER — Encounter (HOSPITAL_BASED_OUTPATIENT_CLINIC_OR_DEPARTMENT_OTHER): Payer: Self-pay | Admitting: Pharmacist

## 2022-01-18 ENCOUNTER — Other Ambulatory Visit (HOSPITAL_BASED_OUTPATIENT_CLINIC_OR_DEPARTMENT_OTHER): Payer: Self-pay

## 2022-01-18 MED ORDER — ATORVASTATIN CALCIUM 40 MG PO TABS
40.0000 mg | ORAL_TABLET | Freq: Every day | ORAL | 3 refills | Status: DC
  Filled 2022-01-18: qty 90, 90d supply, fill #0
  Filled 2022-04-21: qty 90, 90d supply, fill #1
  Filled 2022-07-10: qty 90, 90d supply, fill #2
  Filled 2022-10-16: qty 90, 90d supply, fill #3

## 2022-01-19 ENCOUNTER — Other Ambulatory Visit (HOSPITAL_BASED_OUTPATIENT_CLINIC_OR_DEPARTMENT_OTHER): Payer: Self-pay

## 2022-01-19 MED ORDER — FREESTYLE LIBRE 2 SENSOR MISC
6 refills | Status: DC
  Filled 2022-01-19: qty 2, 30d supply, fill #0
  Filled 2022-02-10: qty 2, 30d supply, fill #1
  Filled 2022-03-12: qty 2, 30d supply, fill #2
  Filled 2022-04-14: qty 2, 30d supply, fill #3
  Filled 2022-05-12: qty 2, 30d supply, fill #4
  Filled 2022-06-09: qty 2, 30d supply, fill #5
  Filled 2022-06-28: qty 2, 30d supply, fill #6

## 2022-01-19 MED ORDER — CITALOPRAM HYDROBROMIDE 20 MG PO TABS
ORAL_TABLET | ORAL | 3 refills | Status: DC
  Filled 2022-01-19: qty 90, 90d supply, fill #0
  Filled 2022-04-21: qty 90, 90d supply, fill #1
  Filled 2022-10-16: qty 90, 90d supply, fill #2

## 2022-01-21 ENCOUNTER — Other Ambulatory Visit (HOSPITAL_BASED_OUTPATIENT_CLINIC_OR_DEPARTMENT_OTHER): Payer: Self-pay

## 2022-01-25 ENCOUNTER — Ambulatory Visit (HOSPITAL_BASED_OUTPATIENT_CLINIC_OR_DEPARTMENT_OTHER)
Admission: RE | Admit: 2022-01-25 | Discharge: 2022-01-25 | Disposition: A | Discharge status: Home / Self Care | Payer: No Typology Code available for payment source | Source: Ambulatory Visit | Attending: Family Medicine | Admitting: Family Medicine

## 2022-01-25 DIAGNOSIS — Z780 Asymptomatic menopausal state: Secondary | ICD-10-CM | POA: Diagnosis present

## 2022-01-27 ENCOUNTER — Other Ambulatory Visit (HOSPITAL_BASED_OUTPATIENT_CLINIC_OR_DEPARTMENT_OTHER): Payer: Self-pay

## 2022-02-07 ENCOUNTER — Other Ambulatory Visit (HOSPITAL_BASED_OUTPATIENT_CLINIC_OR_DEPARTMENT_OTHER): Payer: Self-pay

## 2022-02-11 ENCOUNTER — Other Ambulatory Visit (HOSPITAL_BASED_OUTPATIENT_CLINIC_OR_DEPARTMENT_OTHER): Payer: Self-pay

## 2022-02-14 ENCOUNTER — Other Ambulatory Visit (HOSPITAL_BASED_OUTPATIENT_CLINIC_OR_DEPARTMENT_OTHER): Payer: Self-pay

## 2022-02-14 MED ORDER — FREESTYLE LIBRE 2 READER DEVI
0 refills | Status: DC
  Filled 2022-02-14: qty 1, 1d supply, fill #0

## 2022-02-15 ENCOUNTER — Other Ambulatory Visit (HOSPITAL_BASED_OUTPATIENT_CLINIC_OR_DEPARTMENT_OTHER): Payer: Self-pay

## 2022-02-16 ENCOUNTER — Other Ambulatory Visit (HOSPITAL_BASED_OUTPATIENT_CLINIC_OR_DEPARTMENT_OTHER): Payer: Self-pay

## 2022-02-17 ENCOUNTER — Other Ambulatory Visit (HOSPITAL_BASED_OUTPATIENT_CLINIC_OR_DEPARTMENT_OTHER): Payer: Self-pay

## 2022-02-18 ENCOUNTER — Other Ambulatory Visit (HOSPITAL_BASED_OUTPATIENT_CLINIC_OR_DEPARTMENT_OTHER): Payer: Self-pay

## 2022-02-18 MED ORDER — CITALOPRAM HYDROBROMIDE 20 MG PO TABS
ORAL_TABLET | ORAL | 2 refills | Status: DC
  Filled 2022-02-18 – 2022-07-10 (×2): qty 90, 90d supply, fill #0
  Filled 2023-01-17: qty 90, 90d supply, fill #1

## 2022-02-21 ENCOUNTER — Other Ambulatory Visit (HOSPITAL_BASED_OUTPATIENT_CLINIC_OR_DEPARTMENT_OTHER): Payer: Self-pay

## 2022-03-02 ENCOUNTER — Ambulatory Visit: Payer: No Typology Code available for payment source | Admitting: Physician Assistant

## 2022-03-12 ENCOUNTER — Other Ambulatory Visit (HOSPITAL_BASED_OUTPATIENT_CLINIC_OR_DEPARTMENT_OTHER): Payer: Self-pay

## 2022-03-14 ENCOUNTER — Other Ambulatory Visit (HOSPITAL_BASED_OUTPATIENT_CLINIC_OR_DEPARTMENT_OTHER): Payer: Self-pay

## 2022-03-15 ENCOUNTER — Other Ambulatory Visit (HOSPITAL_BASED_OUTPATIENT_CLINIC_OR_DEPARTMENT_OTHER): Payer: Self-pay

## 2022-03-15 MED ORDER — LISINOPRIL 5 MG PO TABS
5.0000 mg | ORAL_TABLET | Freq: Every day | ORAL | 2 refills | Status: DC
  Filled 2022-03-15: qty 90, 90d supply, fill #0
  Filled 2022-06-09: qty 90, 90d supply, fill #1
  Filled 2022-09-09: qty 90, 90d supply, fill #2

## 2022-03-17 ENCOUNTER — Other Ambulatory Visit (HOSPITAL_BASED_OUTPATIENT_CLINIC_OR_DEPARTMENT_OTHER): Payer: Self-pay

## 2022-03-17 MED ORDER — TRULICITY 0.75 MG/0.5ML ~~LOC~~ SOAJ
0.7500 mg | SUBCUTANEOUS | 1 refills | Status: DC
  Filled 2022-04-21: qty 12, 168d supply, fill #0
  Filled 2022-04-22: qty 6, 84d supply, fill #0
  Filled 2022-07-10: qty 6, 84d supply, fill #1
  Filled 2022-10-03 – 2022-10-11 (×2): qty 6, 84d supply, fill #2
  Filled 2023-01-12: qty 2, 28d supply, fill #3
  Filled 2023-01-17: qty 6, 84d supply, fill #3

## 2022-03-17 MED ORDER — METFORMIN HCL ER 500 MG PO TB24
500.0000 mg | ORAL_TABLET | Freq: Two times a day (BID) | ORAL | 1 refills | Status: DC
  Filled 2022-09-20: qty 90, 45d supply, fill #0
  Filled 2022-10-24 – 2022-11-14 (×2): qty 90, 45d supply, fill #1

## 2022-04-14 ENCOUNTER — Other Ambulatory Visit (HOSPITAL_BASED_OUTPATIENT_CLINIC_OR_DEPARTMENT_OTHER): Payer: Self-pay

## 2022-04-15 ENCOUNTER — Other Ambulatory Visit (HOSPITAL_BASED_OUTPATIENT_CLINIC_OR_DEPARTMENT_OTHER): Payer: Self-pay

## 2022-04-15 MED ORDER — COVID-19 MRNA 2023-2024 VACCINE (COMIRNATY) 0.3 ML INJECTION
INTRAMUSCULAR | 0 refills | Status: DC
  Filled 2022-04-15: qty 0.3, 1d supply, fill #0

## 2022-04-21 ENCOUNTER — Other Ambulatory Visit (HOSPITAL_BASED_OUTPATIENT_CLINIC_OR_DEPARTMENT_OTHER): Payer: Self-pay

## 2022-04-22 ENCOUNTER — Other Ambulatory Visit (HOSPITAL_BASED_OUTPATIENT_CLINIC_OR_DEPARTMENT_OTHER): Payer: Self-pay

## 2022-05-12 ENCOUNTER — Other Ambulatory Visit (HOSPITAL_BASED_OUTPATIENT_CLINIC_OR_DEPARTMENT_OTHER): Payer: Self-pay

## 2022-06-10 ENCOUNTER — Other Ambulatory Visit (HOSPITAL_BASED_OUTPATIENT_CLINIC_OR_DEPARTMENT_OTHER): Payer: Self-pay

## 2022-06-10 ENCOUNTER — Other Ambulatory Visit: Payer: Self-pay

## 2022-06-28 ENCOUNTER — Other Ambulatory Visit (HOSPITAL_BASED_OUTPATIENT_CLINIC_OR_DEPARTMENT_OTHER): Payer: Self-pay

## 2022-07-07 DIAGNOSIS — J310 Chronic rhinitis: Secondary | ICD-10-CM | POA: Diagnosis not present

## 2022-07-07 DIAGNOSIS — H938X3 Other specified disorders of ear, bilateral: Secondary | ICD-10-CM | POA: Diagnosis not present

## 2022-07-10 ENCOUNTER — Telehealth: Payer: Commercial Managed Care - PPO | Admitting: Family

## 2022-07-10 ENCOUNTER — Encounter: Payer: Self-pay | Admitting: Family

## 2022-07-10 ENCOUNTER — Other Ambulatory Visit (HOSPITAL_BASED_OUTPATIENT_CLINIC_OR_DEPARTMENT_OTHER): Payer: Self-pay

## 2022-07-10 ENCOUNTER — Telehealth: Payer: Commercial Managed Care - PPO | Admitting: Family Medicine

## 2022-07-10 DIAGNOSIS — U071 COVID-19: Secondary | ICD-10-CM | POA: Diagnosis not present

## 2022-07-10 MED ORDER — MOLNUPIRAVIR EUA 200MG CAPSULE: 4.0000 | ORAL_CAPSULE | Freq: Two times a day (BID) | ORAL | 0 refills | Status: AC

## 2022-07-10 NOTE — Progress Notes (Signed)
Virtual Visit Consent   JHADA RISK, you are scheduled for a virtual visit with a [facility] provider today. Just as with appointments in the office, your consent must be obtained to participate. Your consent will be active for this visit and any virtual visit you may have with one of our providers in the next 365 days. If you have a MyChart account, a copy of this consent can be sent to you electronically.  As this is a virtual visit, video technology does not allow for your provider to perform a traditional examination. This may limit your provider's ability to fully assess your condition. If your provider identifies any concerns that need to be evaluated in person or the need to arrange testing (such as labs, EKG, etc.), we will make arrangements to do so. Although advances in technology are sophisticated, we cannot ensure that it will always work on either your end or our end. If the connection with a video visit is poor, the visit may have to be switched to a telephone visit. With either a video or telephone visit, we are not always able to ensure that we have a secure connection.  By engaging in this virtual visit, you consent to the provision of healthcare and authorize for your insurance to be billed (if applicable) for the services provided during this visit. Depending on your insurance coverage, you may receive a charge related to this service.  I need to obtain your verbal consent now. Are you willing to proceed with your visit today? Victoria Mooney has provided verbal consent on 07/10/2022 for a virtual visit (video or telephone). Dellia Nims, FNP  Date: 07/10/2022 10:44 AM  Virtual Visit via Video Note   I, Dellia Nims, connected with  Victoria Mooney  ([id number], 09/03/1955) on 07/10/22 at 10:30 AM EST by a video-enabled telemedicine application and verified that I am speaking with the correct person using two identifiers.  Location: Patient: Virtual Visit  Location Patient: Home Provider: Virtual Visit Location Provider: Home Office   I discussed the limitations of evaluation and management by telemedicine and the availability of in person appointments. The patient expressed understanding and agreed to proceed.    History of Present Illness: Victoria Mooney is a 67 y.o. who identifies as a female who was assigned female at birth, and is being seen today for covid positive test at home. She has had congestion and tested neg for covid tues or wed. Sx worsened the end of week. Mild cough, head congestion, mild temp, no body aches or chills. Marland Kitchen  HPI: HPI  Problems: There are no problems to display for this patient.   Allergies:  Allergies  Allergen Reactions   Sulfa Antibiotics    Medications:  Current Outpatient Medications:    aspirin 81 MG tablet, Take 81 mg by mouth daily., Disp: , Rfl:    atorvastatin (LIPITOR) 40 MG tablet, Take 40 mg by mouth daily., Disp: , Rfl:    atorvastatin (LIPITOR) 40 MG tablet, TAKE 1 TABLET BY MOUTH ONCE DAILY, Disp: 90 tablet, Rfl: 1   atorvastatin (LIPITOR) 40 MG tablet, TAKE 1 TABLET BY MOUTH ONCE DAILY, Disp: 90 tablet, Rfl: 3   atovaquone-proguanil (MALARONE) 250-100 MG TABS tablet, Take 1 tablet by mouth daily begin 2 says pre arrival, take daily during stay and continue for 7 days post travel for malaria prevention., Disp: 22 tablet, Rfl: 0   azithromycin (ZITHROMAX) 500 MG tablet, For non bloody diarrhea, take 2 tablets on day  1. If resolved, stop medication. If diarrhea persists take 1 tablet on day 2 & 3. For bloody diarrhea, take 2 tablets on day 1 and and 1 tablet on days 2 & 3., Disp: 4 tablet, Rfl: 0   calcium-vitamin D (OSCAL WITH D) 250-125 MG-UNIT tablet, Take 1 tablet by mouth daily., Disp: , Rfl:    citalopram (CELEXA) 20 MG tablet, TAKE 1 TABLET BY MOUTH ONCE DAILY, Disp: 90 tablet, Rfl: 1   citalopram (CELEXA) 20 MG tablet, TAKE 1 TABLET BY MOUTH ONCE A DAY, Disp: 90 tablet, Rfl: 1    citalopram (CELEXA) 20 MG tablet, 1 tablet at night Orally Once a day, Disp: 90 tablet, Rfl: 3   citalopram (CELEXA) 20 MG tablet, 1 tablet at night Orally Once a day, Disp: 90 tablet, Rfl: 2   citalopram (CELEXA) 40 MG tablet, 1 tablet Orally Once a day 90 day(s), Disp: 90 tablet, Rfl: 3   Continuous Blood Gluc Receiver (FREESTYLE LIBRE 2 READER) DEVI, Use as directed., Disp: 1 each, Rfl: 0   Continuous Blood Gluc Sensor (FREESTYLE LIBRE 2 SENSOR) MISC, Use as directed., Disp: 2 each, Rfl: 6   COVID-19 At Home Antigen Test (CARESTART COVID-19 HOME TEST) KIT, Use as directed within package instructions, Disp: 2 kit, Rfl: 0   COVID-19 mRNA bivalent vaccine, Pfizer, (PFIZER COVID-19 VAC BIVALENT) injection, Inject into the muscle., Disp: 0.3 mL, Rfl: 0   COVID-19 mRNA bivalent vaccine, Pfizer, (PFIZER COVID-19 VAC BIVALENT) injection, Inject into the muscle., Disp: 0.3 mL, Rfl: 0   COVID-19 mRNA vaccine 2023-2024 (COMIRNATY) SUSP injection, Inject into the muscle., Disp: 0.3 mL, Rfl: 0   COVID-19 mRNA vaccine, Moderna, 100 MCG/0.5ML injection, Inject into the muscle., Disp: 0.25 mL, Rfl: 0   Dulaglutide (TRULICITY) 1.85 UD/1.4HF SOPN, Inject subcutaneously once weekly as directed, Disp: 2 mL, Rfl: 2   Dulaglutide (TRULICITY) 0.26 VZ/8.5YI SOPN, as directed Subcutaneous Once a week 30 days, Disp: 2 mL, Rfl: 2   Dulaglutide (TRULICITY) 5.02 DX/4.1OI SOPN, as directed Subcutaneous Once a week 90 days, Disp: 6 mL, Rfl: 1   Dulaglutide (TRULICITY) 7.86 VE/7.2CN SOPN, Inject 0.75 mg into the skin once a week., Disp: 12 mL, Rfl: 1   glipiZIDE (GLUCOTROL XL) 2.5 MG 24 hr tablet, TAKE 3 TABLETS BY MOUTH ONCE A DAY IN DIVIDED DOSES, Disp: 270 tablet, Rfl: 1   glipiZIDE (GLUCOTROL XL) 2.5 MG 24 hr tablet, Take 3 tablets by mouth 3 times a day, Disp: 90 tablet, Rfl: 0   glipiZIDE (GLUCOTROL XL) 2.5 MG 24 hr tablet, Take 3 tablets by mouth three times a day., Disp: 270 tablet, Rfl: 1   glipiZIDE (GLUCOTROL XL) 2.5  MG 24 hr tablet, Take 3 tablets by mouth three times a day, Disp: 270 tablet, Rfl: 1   glipiZIDE (GLUCOTROL XL) 5 MG 24 hr tablet, TAKE 1 TABLET BY MOUTH DAILY WITH FOOD, Disp: 30 tablet, Rfl: 3   lisinopril (PRINIVIL,ZESTRIL) 10 MG tablet, Take 5 mg by mouth daily., Disp: , Rfl:    lisinopril (ZESTRIL) 5 MG tablet, TAKE 1 TABLET BY MOUTH ONCE A DAY, Disp: 90 tablet, Rfl: 0   lisinopril (ZESTRIL) 5 MG tablet, Take 1 tablet (5 mg total) by mouth daily., Disp: 90 tablet, Rfl: 2   loratadine (CLARITIN) 10 MG tablet, Take 10 mg by mouth daily., Disp: , Rfl:    meclizine (ANTIVERT) 25 MG tablet, 1 tablet as needed Orally twice a day as needed 14 days, Disp: 28 tablet, Rfl: 0  metFORMIN (GLUCOPHAGE) 500 MG tablet, Take by mouth 2 (two) times daily with a meal., Disp: , Rfl:    metFORMIN (GLUCOPHAGE-XR) 500 MG 24 hr tablet, TAKE 2 TABLETS BY MOUTH 2 TIMES DAILY, Disp: 360 tablet, Rfl: 1   metFORMIN (GLUCOPHAGE-XR) 500 MG 24 hr tablet, TAKE 2 TABLETS BY MOUTH 2 TIMES DAILY, Disp: 360 tablet, Rfl: 1   metFORMIN (GLUCOPHAGE-XR) 500 MG 24 hr tablet, TAKE 2 TABLETS BY MOUTH TWO TIMES DAILY, Disp: 120 tablet, Rfl: 6   metFORMIN (GLUCOPHAGE-XR) 500 MG 24 hr tablet, 2 tablets Orally Twice daily 90 days, Disp: 360 tablet, Rfl: 1   metFORMIN (GLUCOPHAGE-XR) 500 MG 24 hr tablet, Take 1 tablet (500 mg total) by mouth in the morning and in the afternoon., Disp: 90 tablet, Rfl: 1   Multiple Vitamins-Minerals (MULTIVITAMIN WITH MINERALS) tablet, Take 1 tablet by mouth daily., Disp: , Rfl:    ondansetron (ZOFRAN) 4 MG tablet, 1 tablet Orally every 8 hours as needed for nausea 10 days, Disp: 30 tablet, Rfl: 0   scopolamine (TRANSDERM-SCOP) 1 MG/3DAYS, 1 patch to skin behind the ear as needed Transdermal 30 day(s), Disp: 9 patch, Rfl: 0   zolpidem (AMBIEN) 10 MG tablet, Take 1 tablet by mouth once at bedtime as needed., Disp: 30 tablet, Rfl: 0  Observations/Objective: Patient is well-developed, well-nourished in no acute  distress.  Resting comfortably  at home.  Head is normocephalic, atraumatic.  No labored breathing.  Speech is clear and coherent with logical content.  Patient is alert and oriented at baseline.    Assessment and Plan: 1. COVID  Increase fluids, tylenol or ibuprofen as directed, humidifier at night, mvi with vit d and zinc, urgent care if sx worsen. Declines antivirals. Has guaifenesin to use.  Follow Up Instructions: I discussed the assessment and treatment plan with the patient. The patient was provided an opportunity to ask questions and all were answered. The patient agreed with the plan and demonstrated an understanding of the instructions.  A copy of instructions were sent to the patient via MyChart unless otherwise noted below.     The patient was advised to call back or seek an in-person evaluation if the symptoms worsen or if the condition fails to improve as anticipated.  Time:  I spent 10 minutes with the patient via telehealth technology discussing the above problems/concerns.    Dellia Nims, FNP

## 2022-07-10 NOTE — Patient Instructions (Signed)
Quarantine and Isolation Quarantine and isolation refer to local and travel restrictions to protect the public and travelers from contagious diseases that constitute a public health threat. Contagious diseases are diseases that can spread from one person to another. Quarantine and isolation help to protect the public by preventing exposure to people who have or may have a contagious disease. Isolation separates people who are sick with a contagious disease from people who are not sick. Quarantine separates and restricts the movement of people who were exposed to a contagious disease to see if they become sick. You may be put in quarantine or isolation if you have been exposed to or diagnosed with any of the following diseases: Severe acute respiratory syndromes, such as COVID-19. Cholera. Diphtheria. Tuberculosis. Plague. Smallpox. Yellow fever. Viral hemorrhagic fevers, such as [place], Ebola, and [place]. When to quarantine or isolate Follow these rules, whether you have been vaccinated or not: Stay home and isolate from others when you are sick with a contagious disease. Isolate when you test positive for a contagious disease, even if you do not have symptoms. Isolate if you are sick and suspect that you may have a contagious disease. If you suspect that you have a contagious disease, get tested. If your test results are negative, you can end your isolation. If your test results are positive, follow the full isolation recommendations as told by your health care provider or local health authorities. Quarantine and stay away from others when you have been in close contact with someone who has tested positive for a contagious disease. Close contact is defined as being less than 6 ft (1.8 m) away from an infected person for a total of 15 minutes or more over a 24-hour period. Do not go to places where you are unable to wear a mask, such as restaurants and some gyms. Stay home and separate  from others as much as possible. Avoid being around people who may get very sick from the contagious disease that you have. Use a separate bathroom, if possible. Do not travel. For travel guidance, visit the CDC's travel webpage at wwwnc.cdc.gov/travel/ Follow these instructions at home: Medicines  Take over-the-counter and prescription medicines as told by your health care provider. Finish all antibiotic medicine even when you start to feel better. Stay up to date with all your vaccines. Get scheduled vaccines and boosters as recommended by your health care provider. Lifestyle Wear a high-quality mask if you must be around others at home and in public, if recommended. Improve air flow (ventilation) at home to help prevent the disease from spreading to other people, if possible. Do not share personal household items, like cups, towels, and utensils. Practice everyday hygiene and cleaning. General instructions Talk to your health care provider if you have a weakened body defense system (immune system). People with a weakened immune system may have a reduced immune response to vaccines. You may need to follow current prevention measures, including wearing a well-fitting mask, avoiding crowds, and avoiding poorly ventilated indoor places. Monitor symptoms and follow health care provider instructions, which may include resting, drinking fluids, and taking medicines. Follow specific isolation and quarantine recommendations if you are in places that can lead to disease outbreaks, such as correctional and detention facilities, homeless shelters, and cruise ships. Return to your normal activities as told by your health care provider. Ask your health care provider what activities are safe for you. Keep all follow-up visits. This is important. Where to find more information CDC: www.cdc.gov/quarantine/index.html Contact   a health care provider if: You have a fever. You have signs and symptoms that  return or get worse after isolation. Get help right away if: You have difficulty breathing. You have chest pain. These symptoms may be an emergency. Get help right away. Call 911. Do not wait to see if the symptoms will go away. Do not drive yourself to the hospital. Summary Isolation and quarantine help protect the public by preventing exposure to people who have or may have a contagious disease. Isolate when you are sick or when you test positive, even if you do not have symptoms. Quarantine and stay away from others when you have been in close contact with someone who has tested positive for a contagious disease. This information is not intended to replace advice given to you by your health care provider. Make sure you discuss any questions you have with your health care provider. Document Revised: 07/01/2021 Document Reviewed: 06/10/2021 Elsevier Patient Education  [place] [id number]-65, or coronavirus disease 2019, is an infection that is caused by a new (novel) coronavirus called SARS-CoV-2. COVID-19 can cause many symptoms. In some people, the virus may not cause any symptoms. In others, it may cause mild or severe symptoms. Some people with severe infection develop severe disease. What are the causes? This illness is caused by a virus. The virus may be in the air as tiny specks of fluid (aerosols) or droplets, or it may be on surfaces. You may catch the virus by: Breathing in droplets from an infected person. Droplets can be spread by a person breathing, speaking, singing, coughing, or sneezing. Touching something, like a table or a doorknob, that has virus on it (is contaminated) and then touching your mouth, nose, or eyes. What increases the risk? Risk for infection: You are more likely to get infected with the COVID-19 virus if: You are within 6 ft (1.8 m) of a person with COVID-19 for 15 minutes or longer. You are providing care for a person who is infected with  COVID-19. You are in close personal contact with other people. Close personal contact includes hugging, kissing, or sharing eating or drinking utensils. Risk for serious illness caused by COVID-19: You are more likely to get seriously ill from the COVID-19 virus if: You have cancer. You have a long-term (chronic) disease, such as: Chronic lung disease. This includes pulmonary embolism, chronic obstructive pulmonary disease, and cystic fibrosis. Long-term disease that lowers your body's ability to fight infection (immunocompromise). Serious cardiac conditions, such as heart failure, coronary artery disease, or cardiomyopathy. Diabetes. Chronic kidney disease. Liver diseases. These include cirrhosis, nonalcoholic fatty liver disease, alcoholic liver disease, or autoimmune hepatitis. You have obesity. You are pregnant or were recently pregnant. You have sickle cell disease. What are the signs or symptoms? Symptoms of this condition can range from mild to severe. Symptoms may appear any time from 2 to 14 days after being exposed to the virus. They include: Fever or chills. Shortness of breath or trouble breathing. Feeling tired or very tired. Headaches, body aches, or muscle aches. Runny or stuffy nose, sneezing, coughing, or sore throat. New loss of taste or smell. This is rare. Some people may also have stomach problems, such as nausea, vomiting, or diarrhea. Other people may not have any symptoms of COVID-19. How is this diagnosed? This condition may be diagnosed by testing samples to check for the COVID-19 virus. The most common tests are the PCR test and the antigen test. Tests may be done  in the lab or at home. They include: Using a swab to take a sample of fluid from the back of your nose and throat (nasopharyngeal fluid), from your nose, or from your throat. Testing a sample of saliva from your mouth. Testing a sample of coughed-up mucus from your lungs (sputum). How is this  treated? Treatment for COVID-19 infection depends on the severity of the condition. Mild symptoms can be managed at home with rest, fluids, and over-the-counter medicines. Serious symptoms may be treated in a hospital intensive care unit (ICU). Treatment in the ICU may include: Supplemental oxygen. Extra oxygen is given through a tube in the nose, a face mask, or a hood. Medicines. These may include: Antivirals, such as monoclonal antibodies. These help your body fight off certain viruses that can cause disease. Anti-inflammatories, such as corticosteroids. These reduce inflammation and suppress the immune system. Antithrombotics. These prevent or treat blood clots, if they develop. Convalescent plasma. This helps boost your immune system, if you have an underlying immunosuppressive condition or are getting immunosuppressive treatments. Prone positioning. This means you will lie on your stomach. This helps oxygen to get into your lungs. Infection control measures. If you are at risk for more serious illness caused by COVID-19, your health care provider may prescribe two long-acting monoclonal antibodies, given together every 6 months. How is this prevented? To protect yourself: Use preventive medicine (pre-exposure prophylaxis). You may get pre-exposure prophylaxis if you have moderate or severe immunocompromise. Get vaccinated. Anyone 75 months old or older who meets guidelines can get a COVID-19 vaccine or vaccine series. This includes people who are pregnant or making breast milk (lactating). Get an added dose of COVID-19 vaccine after your first vaccine or vaccine series if you have moderate to severe immunocompromise. This applies if you have had a solid organ transplant or have been diagnosed with an immunocompromising condition. You should get the added dose 4 weeks after you got the first COVID-19 vaccine or vaccine series. If you get an mRNA vaccine, you will need a 3-dose primary  series. If you get the J&J/Janssen vaccine, you will need a 2-dose primary series, with the second dose being an mRNA vaccine. Talk to your health care provider about getting experimental monoclonal antibodies. This treatment is approved under emergency use authorization to prevent severe illness before or after being exposed to the COVID-19 virus. You may be given monoclonal antibodies if: You have moderate or severe immunocompromise. This includes treatments that lower your immune response. People with immunocompromise may not develop protection against COVID-19 when they are vaccinated. You cannot be vaccinated. You may not get a vaccine if you have a severe allergic reaction to the vaccine or its components. You are not fully vaccinated. You are in a facility where COVID-19 is present and: Are in close contact with a person who is infected with the COVID-19 virus. Are at high risk of being exposed to the COVID-19 virus. You are at risk of illness from new variants of the COVID-19 virus. To protect others: If you have symptoms of COVID-19, take steps to prevent the virus from spreading to others. Stay home. Leave your house only to get medical care. Do not use public transit, if possible. Do not travel while you are sick. Wash your hands often with soap and water for at least 20 seconds. If soap and water are not available, use alcohol-based hand sanitizer. Make sure that all people in your household wash their hands well and often. Cough or sneeze  into a tissue or your sleeve or elbow. Do not cough or sneeze into your hand or into the air. Where to find more information Centers for Disease Control and Prevention: CharmCourses.be World Health Organization: https://www.castaneda.info/ Get help right away if: You have trouble breathing. You have pain or pressure in your chest. You are confused. You have bluish lips and fingernails. You have trouble waking from sleep. You  have symptoms that get worse. These symptoms may be an emergency. Get help right away. Call 911. Do not wait to see if the symptoms will go away. Do not drive yourself to the hospital. Summary COVID-19 is an infection that is caused by a new coronavirus. Sometimes, there are no symptoms. Other times, symptoms range from mild to severe. Some people with a severe COVID-19 infection develop severe disease. The virus that causes COVID-19 can spread from person to person through droplets or aerosols from breathing, speaking, singing, coughing, or sneezing. Mild symptoms of COVID-19 can be managed at home with rest, fluids, and over-the-counter medicines. This information is not intended to replace advice given to you by your health care provider. Make sure you discuss any questions you have with your health care provider. Document Revised: 06/08/2021 Document Reviewed: 06/10/2021 Elsevier Patient Education  [place].

## 2022-07-10 NOTE — Progress Notes (Signed)
Pt had visit earlier and had decided against antivirals. She connects today requesting a prescription be sent. Will send and no charge this visit.   [name], FNP

## 2022-07-11 ENCOUNTER — Other Ambulatory Visit (HOSPITAL_BASED_OUTPATIENT_CLINIC_OR_DEPARTMENT_OTHER): Payer: Self-pay

## 2022-07-11 ENCOUNTER — Other Ambulatory Visit: Payer: Self-pay

## 2022-07-11 MED ORDER — ZOLPIDEM TARTRATE 10 MG PO TABS
10.0000 mg | ORAL_TABLET | Freq: Every evening | ORAL | 0 refills | Status: DC | PRN
  Filled 2022-07-11: qty 30, 30d supply, fill #0

## 2022-07-12 ENCOUNTER — Encounter (HOSPITAL_BASED_OUTPATIENT_CLINIC_OR_DEPARTMENT_OTHER): Payer: Self-pay | Admitting: Pharmacist

## 2022-07-12 ENCOUNTER — Other Ambulatory Visit (HOSPITAL_BASED_OUTPATIENT_CLINIC_OR_DEPARTMENT_OTHER): Payer: Self-pay

## 2022-07-12 MED ORDER — GLIPIZIDE ER 2.5 MG PO TB24
2.5000 mg | ORAL_TABLET | Freq: Every day | ORAL | 1 refills | Status: DC
  Filled 2022-07-12: qty 30, 30d supply, fill #0
  Filled 2022-09-20: qty 30, 30d supply, fill #1

## 2022-08-03 ENCOUNTER — Other Ambulatory Visit (HOSPITAL_BASED_OUTPATIENT_CLINIC_OR_DEPARTMENT_OTHER): Payer: Self-pay

## 2022-08-03 ENCOUNTER — Other Ambulatory Visit: Payer: Self-pay | Admitting: Family Medicine

## 2022-08-03 DIAGNOSIS — Z1231 Encounter for screening mammogram for malignant neoplasm of breast: Secondary | ICD-10-CM

## 2022-08-05 ENCOUNTER — Other Ambulatory Visit (HOSPITAL_BASED_OUTPATIENT_CLINIC_OR_DEPARTMENT_OTHER): Payer: Self-pay

## 2022-08-05 MED ORDER — FREESTYLE LIBRE 2 SENSOR MISC
6 refills | Status: DC
  Filled 2022-08-05: qty 2, 28d supply, fill #0
  Filled 2022-09-04: qty 2, 28d supply, fill #1
  Filled 2022-09-20: qty 2, 28d supply, fill #2
  Filled 2022-10-17: qty 2, 28d supply, fill #3
  Filled 2022-11-14: qty 2, 28d supply, fill #4
  Filled 2022-12-20: qty 2, 28d supply, fill #5
  Filled 2023-02-27 – 2023-02-28 (×2): qty 2, 28d supply, fill #6

## 2022-09-04 ENCOUNTER — Other Ambulatory Visit (HOSPITAL_BASED_OUTPATIENT_CLINIC_OR_DEPARTMENT_OTHER): Payer: Self-pay

## 2022-09-09 ENCOUNTER — Other Ambulatory Visit (HOSPITAL_BASED_OUTPATIENT_CLINIC_OR_DEPARTMENT_OTHER): Payer: Self-pay

## 2022-09-15 DIAGNOSIS — E1165 Type 2 diabetes mellitus with hyperglycemia: Secondary | ICD-10-CM | POA: Diagnosis not present

## 2022-09-15 DIAGNOSIS — I10 Essential (primary) hypertension: Secondary | ICD-10-CM | POA: Diagnosis not present

## 2022-09-15 DIAGNOSIS — E785 Hyperlipidemia, unspecified: Secondary | ICD-10-CM | POA: Diagnosis not present

## 2022-09-20 ENCOUNTER — Other Ambulatory Visit (HOSPITAL_BASED_OUTPATIENT_CLINIC_OR_DEPARTMENT_OTHER): Payer: Self-pay

## 2022-09-21 ENCOUNTER — Other Ambulatory Visit (HOSPITAL_BASED_OUTPATIENT_CLINIC_OR_DEPARTMENT_OTHER): Payer: Self-pay

## 2022-10-03 ENCOUNTER — Ambulatory Visit
Admission: RE | Admit: 2022-10-03 | Discharge: 2022-10-03 | Disposition: A | Discharge status: Home / Self Care | Payer: Commercial Managed Care - PPO | Source: Ambulatory Visit | Attending: Family Medicine | Admitting: Family Medicine

## 2022-10-03 ENCOUNTER — Other Ambulatory Visit (HOSPITAL_BASED_OUTPATIENT_CLINIC_OR_DEPARTMENT_OTHER): Payer: Self-pay

## 2022-10-03 DIAGNOSIS — Z1231 Encounter for screening mammogram for malignant neoplasm of breast: Secondary | ICD-10-CM | POA: Diagnosis not present

## 2022-10-10 ENCOUNTER — Other Ambulatory Visit (HOSPITAL_BASED_OUTPATIENT_CLINIC_OR_DEPARTMENT_OTHER): Payer: Self-pay

## 2022-10-10 ENCOUNTER — Encounter (HOSPITAL_BASED_OUTPATIENT_CLINIC_OR_DEPARTMENT_OTHER): Payer: Self-pay

## 2022-10-11 ENCOUNTER — Other Ambulatory Visit (HOSPITAL_BASED_OUTPATIENT_CLINIC_OR_DEPARTMENT_OTHER): Payer: Self-pay

## 2022-10-18 ENCOUNTER — Other Ambulatory Visit (HOSPITAL_BASED_OUTPATIENT_CLINIC_OR_DEPARTMENT_OTHER): Payer: Self-pay

## 2022-10-22 ENCOUNTER — Other Ambulatory Visit (HOSPITAL_BASED_OUTPATIENT_CLINIC_OR_DEPARTMENT_OTHER): Payer: Self-pay

## 2022-10-24 ENCOUNTER — Other Ambulatory Visit (HOSPITAL_BASED_OUTPATIENT_CLINIC_OR_DEPARTMENT_OTHER): Payer: Self-pay

## 2022-11-14 ENCOUNTER — Other Ambulatory Visit (HOSPITAL_BASED_OUTPATIENT_CLINIC_OR_DEPARTMENT_OTHER): Payer: Self-pay

## 2022-11-17 ENCOUNTER — Telehealth: Payer: Commercial Managed Care - PPO | Admitting: Physician Assistant

## 2022-11-17 ENCOUNTER — Other Ambulatory Visit (HOSPITAL_BASED_OUTPATIENT_CLINIC_OR_DEPARTMENT_OTHER): Payer: Self-pay

## 2022-11-17 DIAGNOSIS — J019 Acute sinusitis, unspecified: Secondary | ICD-10-CM | POA: Diagnosis not present

## 2022-11-17 DIAGNOSIS — B9689 Other specified bacterial agents as the cause of diseases classified elsewhere: Secondary | ICD-10-CM

## 2022-11-17 MED ORDER — AMOXICILLIN-POT CLAVULANATE 875-125 MG PO TABS
1.0000 | ORAL_TABLET | Freq: Two times a day (BID) | ORAL | 0 refills | Status: DC
  Filled 2022-11-17: qty 20, 10d supply, fill #0

## 2022-11-17 NOTE — Progress Notes (Signed)

## 2022-12-12 ENCOUNTER — Other Ambulatory Visit: Payer: Self-pay | Admitting: Oncology

## 2022-12-12 DIAGNOSIS — Z006 Encounter for examination for normal comparison and control in clinical research program: Secondary | ICD-10-CM

## 2022-12-13 ENCOUNTER — Other Ambulatory Visit (HOSPITAL_BASED_OUTPATIENT_CLINIC_OR_DEPARTMENT_OTHER): Payer: Self-pay

## 2022-12-13 DIAGNOSIS — I10 Essential (primary) hypertension: Secondary | ICD-10-CM | POA: Diagnosis not present

## 2022-12-13 DIAGNOSIS — Z0000 Encounter for general adult medical examination without abnormal findings: Secondary | ICD-10-CM | POA: Diagnosis not present

## 2022-12-13 DIAGNOSIS — Z86010 Personal history of colonic polyps: Secondary | ICD-10-CM | POA: Diagnosis not present

## 2022-12-13 DIAGNOSIS — E782 Mixed hyperlipidemia: Secondary | ICD-10-CM | POA: Diagnosis not present

## 2022-12-13 DIAGNOSIS — E1169 Type 2 diabetes mellitus with other specified complication: Secondary | ICD-10-CM | POA: Diagnosis not present

## 2022-12-13 DIAGNOSIS — G479 Sleep disorder, unspecified: Secondary | ICD-10-CM | POA: Diagnosis not present

## 2022-12-13 DIAGNOSIS — Z6826 Body mass index (BMI) 26.0-26.9, adult: Secondary | ICD-10-CM | POA: Diagnosis not present

## 2022-12-13 MED ORDER — LISINOPRIL 10 MG PO TABS
10.0000 mg | ORAL_TABLET | Freq: Every day | ORAL | 3 refills | Status: DC
  Filled 2022-12-13: qty 90, 90d supply, fill #0

## 2022-12-14 ENCOUNTER — Other Ambulatory Visit (HOSPITAL_COMMUNITY): Payer: Self-pay | Admitting: Family Medicine

## 2022-12-14 DIAGNOSIS — E782 Mixed hyperlipidemia: Secondary | ICD-10-CM

## 2022-12-15 DIAGNOSIS — E1165 Type 2 diabetes mellitus with hyperglycemia: Secondary | ICD-10-CM | POA: Diagnosis not present

## 2022-12-15 DIAGNOSIS — I10 Essential (primary) hypertension: Secondary | ICD-10-CM | POA: Diagnosis not present

## 2022-12-15 DIAGNOSIS — E785 Hyperlipidemia, unspecified: Secondary | ICD-10-CM | POA: Diagnosis not present

## 2022-12-20 ENCOUNTER — Other Ambulatory Visit (HOSPITAL_BASED_OUTPATIENT_CLINIC_OR_DEPARTMENT_OTHER): Payer: Self-pay

## 2023-01-12 ENCOUNTER — Other Ambulatory Visit (HOSPITAL_BASED_OUTPATIENT_CLINIC_OR_DEPARTMENT_OTHER): Payer: Self-pay

## 2023-01-13 ENCOUNTER — Ambulatory Visit (HOSPITAL_BASED_OUTPATIENT_CLINIC_OR_DEPARTMENT_OTHER)
Admission: RE | Admit: 2023-01-13 | Discharge: 2023-01-13 | Disposition: A | Discharge status: Home / Self Care | Payer: Commercial Managed Care - PPO | Source: Ambulatory Visit | Attending: Family Medicine | Admitting: Family Medicine

## 2023-01-13 ENCOUNTER — Other Ambulatory Visit (HOSPITAL_BASED_OUTPATIENT_CLINIC_OR_DEPARTMENT_OTHER): Payer: Self-pay

## 2023-01-13 ENCOUNTER — Other Ambulatory Visit: Payer: Self-pay

## 2023-01-13 DIAGNOSIS — E782 Mixed hyperlipidemia: Secondary | ICD-10-CM | POA: Insufficient documentation

## 2023-01-16 ENCOUNTER — Other Ambulatory Visit (HOSPITAL_BASED_OUTPATIENT_CLINIC_OR_DEPARTMENT_OTHER): Payer: Self-pay

## 2023-01-17 ENCOUNTER — Other Ambulatory Visit (HOSPITAL_BASED_OUTPATIENT_CLINIC_OR_DEPARTMENT_OTHER): Payer: Self-pay

## 2023-01-17 ENCOUNTER — Other Ambulatory Visit: Payer: Self-pay

## 2023-01-17 MED ORDER — ATORVASTATIN CALCIUM 40 MG PO TABS
40.0000 mg | ORAL_TABLET | Freq: Every day | ORAL | 0 refills | Status: DC
  Filled 2023-01-17: qty 90, 90d supply, fill #0

## 2023-01-19 ENCOUNTER — Other Ambulatory Visit (HOSPITAL_BASED_OUTPATIENT_CLINIC_OR_DEPARTMENT_OTHER): Payer: Self-pay

## 2023-01-19 MED ORDER — ATORVASTATIN CALCIUM 80 MG PO TABS
80.0000 mg | ORAL_TABLET | Freq: Every day | ORAL | 0 refills | Status: DC
  Filled 2023-03-06: qty 90, 90d supply, fill #0

## 2023-01-22 ENCOUNTER — Other Ambulatory Visit (HOSPITAL_BASED_OUTPATIENT_CLINIC_OR_DEPARTMENT_OTHER): Payer: Self-pay

## 2023-01-23 ENCOUNTER — Other Ambulatory Visit (HOSPITAL_BASED_OUTPATIENT_CLINIC_OR_DEPARTMENT_OTHER): Payer: Self-pay

## 2023-01-23 ENCOUNTER — Other Ambulatory Visit: Payer: Self-pay

## 2023-01-23 MED ORDER — METFORMIN HCL ER 500 MG PO TB24: 500.0000 mg | ORAL_TABLET | ORAL | 1 refills | Status: DC

## 2023-01-23 MED ORDER — GLIPIZIDE ER 2.5 MG PO TB24
2.5000 mg | ORAL_TABLET | Freq: Every day | ORAL | 1 refills | Status: DC
  Filled 2023-01-23 – 2023-03-06 (×2): qty 90, 90d supply, fill #0

## 2023-01-23 MED ORDER — GLIPIZIDE ER 2.5 MG PO TB24
2.5000 mg | ORAL_TABLET | Freq: Every day | ORAL | 0 refills | Status: DC
  Filled 2023-01-23: qty 90, 90d supply, fill #0

## 2023-01-23 MED ORDER — METFORMIN HCL ER 500 MG PO TB24
500.0000 mg | ORAL_TABLET | Freq: Two times a day (BID) | ORAL | 0 refills | Status: DC
  Filled 2023-01-23: qty 90, 45d supply, fill #0

## 2023-01-24 ENCOUNTER — Other Ambulatory Visit: Payer: Self-pay

## 2023-01-24 ENCOUNTER — Other Ambulatory Visit (HOSPITAL_BASED_OUTPATIENT_CLINIC_OR_DEPARTMENT_OTHER): Payer: Self-pay

## 2023-01-24 MED ORDER — METFORMIN HCL ER 500 MG PO TB24
ORAL_TABLET | ORAL | 1 refills | Status: DC
  Filled 2023-01-24: qty 180, 90d supply, fill #0
  Filled 2023-03-06: qty 90, 45d supply, fill #0
  Filled 2023-04-29: qty 90, 45d supply, fill #1

## 2023-01-25 ENCOUNTER — Other Ambulatory Visit (HOSPITAL_BASED_OUTPATIENT_CLINIC_OR_DEPARTMENT_OTHER): Payer: Self-pay

## 2023-01-26 ENCOUNTER — Other Ambulatory Visit (HOSPITAL_BASED_OUTPATIENT_CLINIC_OR_DEPARTMENT_OTHER): Payer: Self-pay

## 2023-01-27 ENCOUNTER — Other Ambulatory Visit (HOSPITAL_BASED_OUTPATIENT_CLINIC_OR_DEPARTMENT_OTHER): Payer: Self-pay

## 2023-01-30 DIAGNOSIS — Z6826 Body mass index (BMI) 26.0-26.9, adult: Secondary | ICD-10-CM | POA: Diagnosis not present

## 2023-01-30 DIAGNOSIS — F418 Other specified anxiety disorders: Secondary | ICD-10-CM | POA: Diagnosis not present

## 2023-01-30 DIAGNOSIS — F32A Depression, unspecified: Secondary | ICD-10-CM | POA: Diagnosis not present

## 2023-01-30 DIAGNOSIS — F430 Acute stress reaction: Secondary | ICD-10-CM | POA: Diagnosis not present

## 2023-02-27 ENCOUNTER — Other Ambulatory Visit (HOSPITAL_BASED_OUTPATIENT_CLINIC_OR_DEPARTMENT_OTHER): Payer: Self-pay

## 2023-02-27 ENCOUNTER — Other Ambulatory Visit (HOSPITAL_COMMUNITY): Payer: Self-pay

## 2023-02-28 ENCOUNTER — Other Ambulatory Visit (HOSPITAL_BASED_OUTPATIENT_CLINIC_OR_DEPARTMENT_OTHER): Payer: Self-pay

## 2023-03-01 DIAGNOSIS — F418 Other specified anxiety disorders: Secondary | ICD-10-CM | POA: Diagnosis not present

## 2023-03-01 DIAGNOSIS — F430 Acute stress reaction: Secondary | ICD-10-CM | POA: Diagnosis not present

## 2023-03-01 DIAGNOSIS — F32A Depression, unspecified: Secondary | ICD-10-CM | POA: Diagnosis not present

## 2023-03-06 ENCOUNTER — Other Ambulatory Visit (HOSPITAL_BASED_OUTPATIENT_CLINIC_OR_DEPARTMENT_OTHER): Payer: Self-pay

## 2023-03-07 ENCOUNTER — Other Ambulatory Visit (HOSPITAL_BASED_OUTPATIENT_CLINIC_OR_DEPARTMENT_OTHER): Payer: Self-pay

## 2023-03-07 ENCOUNTER — Other Ambulatory Visit: Payer: Self-pay

## 2023-03-08 ENCOUNTER — Encounter: Payer: Self-pay | Admitting: Cardiology

## 2023-03-08 ENCOUNTER — Other Ambulatory Visit (HOSPITAL_BASED_OUTPATIENT_CLINIC_OR_DEPARTMENT_OTHER): Payer: Self-pay

## 2023-03-08 ENCOUNTER — Ambulatory Visit: Payer: Commercial Managed Care - PPO | Admitting: Cardiology

## 2023-03-08 VITALS — BP 141/57 | HR 66 | Resp 16 | Ht 60.0 in | Wt 135.2 lb

## 2023-03-08 DIAGNOSIS — E7800 Pure hypercholesterolemia, unspecified: Secondary | ICD-10-CM

## 2023-03-08 DIAGNOSIS — R931 Abnormal findings on diagnostic imaging of heart and coronary circulation: Secondary | ICD-10-CM

## 2023-03-08 DIAGNOSIS — I10 Essential (primary) hypertension: Secondary | ICD-10-CM | POA: Diagnosis not present

## 2023-03-08 DIAGNOSIS — E119 Type 2 diabetes mellitus without complications: Secondary | ICD-10-CM | POA: Diagnosis not present

## 2023-03-08 MED ORDER — CITALOPRAM HYDROBROMIDE 20 MG PO TABS
30.0000 mg | ORAL_TABLET | Freq: Every day | ORAL | 0 refills | Status: DC
  Filled 2023-03-08 – 2023-06-17 (×4): qty 90, 60d supply, fill #0

## 2023-03-08 MED ORDER — LISINOPRIL-HYDROCHLOROTHIAZIDE 20-12.5 MG PO TABS
1.0000 | ORAL_TABLET | Freq: Every day | ORAL | 1 refills | Status: DC
Start: 2023-03-08 — End: 2023-09-02
  Filled 2023-03-08: qty 90, 90d supply, fill #0
  Filled 2023-06-03: qty 90, 90d supply, fill #1

## 2023-03-08 NOTE — Progress Notes (Unsigned)
Primary Physician/Referring:  Sigmund Hazel, MD  Patient ID: Victoria Mooney, female    DOB: 05-06-1956, 67 y.o.   MRN: [id number]  Chief Complaint  Patient presents with  . aortic atherosclerosis  . New Patient (Initial Visit)   HPI:    Victoria Mooney  is a 67 y.o. ***  Past Medical History:  Diagnosis Date  . Diabetes mellitus without complication (HCC)   . Hyperlipidemia    Past Surgical History:  Procedure Laterality Date  . CESAREAN SECTION     Family History  Problem Relation Age of Onset  . Other Mother        Second degree Mobitz II AV block  . Hypertension Mother   . Heart attack Mother   . Atrial fibrillation Mother   . Stroke Mother   . Edema Mother   . Heart attack Father   . Brain cancer Father 6  . Heart attack Maternal Grandfather   . Dementia Maternal Grandfather   . Dementia Paternal Grandmother   . Other Paternal Grandfather        Brain tumor  . Colon cancer Paternal Grandfather 14    Social History   Tobacco Use  . Smoking status: Never  . Smokeless tobacco: Never  Substance Use Topics  . Alcohol use: Yes    Alcohol/week: 3.0 standard drinks of alcohol    Types: 1 Glasses of wine, 1 Cans of beer, 1 Shots of liquor per week    Comment: 3-4 glasses a week   Marital Status: Married  ROS  ***ROS Objective      03/08/2023    1:46 PM 09/29/2015    7:38 AM  Vitals with BMI  Height 5\' 0"  5\' 0"   Weight 135 lbs 3 oz 138 lbs 13 oz  BMI 26.4 27.2  Systolic 141   Diastolic 57   Pulse 66    Blood pressure (!) 141/57, pulse 66, resp. rate 16, height 5' (1.524 m), weight 135 lb 3.2 oz (61.3 kg), SpO2 97%.   ***Physical Exam Laboratory examination:   External labs:   Lab 12/13/2022:  Total cholesterol 150, triglycerides 83, HDL 53, LDL 81.  Non-HDL cholesterol 97.  A1c 6.7%.  Serum glucose 100 mg, BUN 14, creatinine 0.75, EGFR 87 mL, potassium 4.5, LFTs normal.  Urinary microalbumin to creatinine ratio <22.  TSH  2.570 11/25/2019  Radiology:   Coronary calcium score 01/13/2023 Total Agatston coronary calcium score 120. MESA database percentile 81. LM: 0 LAD: 1.37 LCx: 30.5 RCA: 88.5 Visualized ascending and descending aorta normal dimensions, aortic atherosclerosis.Marland Kitchen Extracardiac abnormalities: 6 mm left lower lobe nodule.  Otherwise no significant extracardiac abnormality.  Mild degenerative changes in the thoracic spine..   Cardiac Studies:   ***  EKG:   EKG 03/08/2023: Normal sinus rhythm at rate of 66 bpm, normal EKG.    Medications and allergies   Allergies  Allergen Reactions  . Sulfa Antibiotics     Medication list   Current Outpatient Medications:  .  aspirin EC 81 MG tablet, Take 81 mg by mouth daily., Disp: , Rfl:  .  atorvastatin (LIPITOR) 80 MG tablet, Take 1 tablet (80 mg total) by mouth daily., Disp: 90 tablet, Rfl: 0 .  calcium-vitamin D (OSCAL WITH D) 250-125 MG-UNIT tablet, Take 1 tablet by mouth daily., Disp: , Rfl:  .  citalopram (CELEXA) 20 MG tablet, TAKE 1 TABLET BY MOUTH ONCE DAILY (Patient taking differently: Take 1.5 tablets by mouth daily.), Disp: 90 tablet, Rfl: 1 .  Dulaglutide (TRULICITY) 0.75 MG/0.5ML SOPN, Inject 0.75 mg into the skin once a week., Disp: 12 mL, Rfl: 1 .  glipiZIDE (GLUCOTROL XL) 2.5 MG 24 hr tablet, Take 3 tablets by mouth three times a day., Disp: 270 tablet, Rfl: 1 .  lisinopril-hydrochlorothiazide (ZESTORETIC) 20-12.5 MG tablet, Take 1 tablet by mouth daily., Disp: 90 tablet, Rfl: 1 .  metFORMIN (GLUCOPHAGE-XR) 500 MG 24 hr tablet, Take 1 tablet (500 mg total) by mouth in the morning AND 1 tablet (500 mg total) every evening., Disp: 90 tablet, Rfl: 1 .  Multiple Vitamins-Minerals (MULTIVITAMIN WITH MINERALS) tablet, Take 1 tablet by mouth daily., Disp: , Rfl:  .  zolpidem (AMBIEN) 10 MG tablet, Take 1 tablet (10 mg total) by mouth at bedtime as needed., Disp: 30 tablet, Rfl: 0  Assessment     ICD-10-CM   1. Elevated coronary artery  calcium score 01/13/2023: Total Agatston score 120. MESA database percentile 81.  R93.1 EKG 12-Lead    2. Type 2 diabetes mellitus without complication, without long-term current use of insulin (HCC)  E11.9     3. Primary hypertension  I10 lisinopril-hydrochlorothiazide (ZESTORETIC) 20-12.5 MG tablet       Orders Placed This Encounter  Procedures  . EKG 12-Lead    Meds ordered this encounter  Medications  . lisinopril-hydrochlorothiazide (ZESTORETIC) 20-12.5 MG tablet    Sig: Take 1 tablet by mouth daily.    Dispense:  90 tablet    Refill:  1    Refills to Dr. Sigmund Hazel   Recommendations:   Victoria Mooney is a 67 y.o.  ***    Yates Decamp, MD, [facility] 03/08/2023, 2:08 PM Office: [phone]

## 2023-03-09 ENCOUNTER — Other Ambulatory Visit (HOSPITAL_BASED_OUTPATIENT_CLINIC_OR_DEPARTMENT_OTHER): Payer: Self-pay

## 2023-03-09 MED ORDER — EZETIMIBE 10 MG PO TABS
10.0000 mg | ORAL_TABLET | Freq: Every day | ORAL | 3 refills | Status: DC
Start: 2023-03-09 — End: 2024-04-01
  Filled 2023-04-16: qty 90, 90d supply, fill #0
  Filled 2023-07-19: qty 90, 90d supply, fill #1
  Filled 2023-10-24: qty 90, 90d supply, fill #2
  Filled 2024-01-25: qty 60, 60d supply, fill #3

## 2023-03-09 MED ORDER — EZETIMIBE 10 MG PO TABS
10.0000 mg | ORAL_TABLET | Freq: Every day | ORAL | 3 refills | Status: DC
Start: 2023-03-09 — End: 2023-03-09

## 2023-03-09 NOTE — Addendum Note (Signed)
Addended by: Delrae Rend on: 03/09/2023 01:05 PM   Modules accepted: Orders

## 2023-03-10 ENCOUNTER — Other Ambulatory Visit (HOSPITAL_BASED_OUTPATIENT_CLINIC_OR_DEPARTMENT_OTHER): Payer: Self-pay

## 2023-03-10 ENCOUNTER — Other Ambulatory Visit (HOSPITAL_COMMUNITY)
Admission: RE | Admit: 2023-03-10 | Discharge: 2023-03-10 | Disposition: A | Discharge status: Home / Self Care | Payer: Commercial Managed Care - PPO | Source: Ambulatory Visit | Attending: Oncology | Admitting: Oncology

## 2023-03-10 DIAGNOSIS — Z006 Encounter for examination for normal comparison and control in clinical research program: Secondary | ICD-10-CM | POA: Insufficient documentation

## 2023-03-21 ENCOUNTER — Other Ambulatory Visit (HOSPITAL_BASED_OUTPATIENT_CLINIC_OR_DEPARTMENT_OTHER): Payer: Self-pay

## 2023-03-21 MED ORDER — COVID-19 MRNA VAC-TRIS(PFIZER) 30 MCG/0.3ML IM SUSY
0.3000 mL | PREFILLED_SYRINGE | Freq: Once | INTRAMUSCULAR | 0 refills | Status: AC
  Filled 2023-03-21: qty 0.3, 1d supply, fill #0

## 2023-03-21 MED ORDER — INFLUENZA VAC A&B SURF ANT ADJ 0.5 ML IM SUSY
0.5000 mL | PREFILLED_SYRINGE | Freq: Once | INTRAMUSCULAR | 0 refills | Status: AC
  Filled 2023-03-21: qty 0.5, 1d supply, fill #0

## 2023-03-22 ENCOUNTER — Other Ambulatory Visit (HOSPITAL_BASED_OUTPATIENT_CLINIC_OR_DEPARTMENT_OTHER): Payer: Self-pay

## 2023-03-22 MED ORDER — CITALOPRAM HYDROBROMIDE 20 MG PO TABS
30.0000 mg | ORAL_TABLET | Freq: Every day | ORAL | 0 refills | Status: DC
  Filled 2023-03-22: qty 135, 90d supply, fill #0

## 2023-03-24 DIAGNOSIS — I2510 Atherosclerotic heart disease of native coronary artery without angina pectoris: Secondary | ICD-10-CM | POA: Diagnosis not present

## 2023-03-24 DIAGNOSIS — F418 Other specified anxiety disorders: Secondary | ICD-10-CM | POA: Diagnosis not present

## 2023-03-24 DIAGNOSIS — F32A Depression, unspecified: Secondary | ICD-10-CM | POA: Diagnosis not present

## 2023-03-24 DIAGNOSIS — F430 Acute stress reaction: Secondary | ICD-10-CM | POA: Diagnosis not present

## 2023-03-24 DIAGNOSIS — Z6826 Body mass index (BMI) 26.0-26.9, adult: Secondary | ICD-10-CM | POA: Diagnosis not present

## 2023-03-24 DIAGNOSIS — I10 Essential (primary) hypertension: Secondary | ICD-10-CM | POA: Diagnosis not present

## 2023-04-01 ENCOUNTER — Other Ambulatory Visit (HOSPITAL_BASED_OUTPATIENT_CLINIC_OR_DEPARTMENT_OTHER): Payer: Self-pay

## 2023-04-01 MED ORDER — FREESTYLE LIBRE 2 SENSOR MISC
1.0000 | 6 refills | Status: DC
  Filled 2023-04-01: qty 2, 28d supply, fill #0
  Filled 2023-04-29: qty 2, 28d supply, fill #1
  Filled 2023-05-30: qty 2, 28d supply, fill #2
  Filled 2023-06-23: qty 2, 28d supply, fill #3
  Filled 2023-07-19: qty 2, 28d supply, fill #4
  Filled 2023-08-19: qty 2, 28d supply, fill #5
  Filled 2023-09-17: qty 2, 28d supply, fill #6

## 2023-04-05 ENCOUNTER — Other Ambulatory Visit (HOSPITAL_BASED_OUTPATIENT_CLINIC_OR_DEPARTMENT_OTHER): Payer: Self-pay

## 2023-04-05 ENCOUNTER — Other Ambulatory Visit (HOSPITAL_COMMUNITY): Payer: Self-pay

## 2023-04-11 ENCOUNTER — Other Ambulatory Visit (HOSPITAL_BASED_OUTPATIENT_CLINIC_OR_DEPARTMENT_OTHER): Payer: Self-pay

## 2023-04-11 MED ORDER — PEG 3350-KCL-NA BICARB-NACL 420 G PO SOLR
ORAL | 0 refills | Status: DC
  Filled 2023-04-11: qty 4000, 1d supply, fill #0

## 2023-04-11 MED ORDER — TRULICITY 0.75 MG/0.5ML ~~LOC~~ SOAJ
0.7500 mg | SUBCUTANEOUS | 3 refills | Status: DC
  Filled 2023-04-11: qty 6, 84d supply, fill #0

## 2023-04-11 MED ORDER — TRULICITY 0.75 MG/0.5ML ~~LOC~~ SOAJ
0.7500 mg | SUBCUTANEOUS | 5 refills | Status: DC
  Filled 2023-04-11: qty 2, 28d supply, fill #0

## 2023-04-12 DIAGNOSIS — H25811 Combined forms of age-related cataract, right eye: Secondary | ICD-10-CM | POA: Diagnosis not present

## 2023-04-12 DIAGNOSIS — E119 Type 2 diabetes mellitus without complications: Secondary | ICD-10-CM | POA: Diagnosis not present

## 2023-04-12 DIAGNOSIS — H18513 Endothelial corneal dystrophy, bilateral: Secondary | ICD-10-CM | POA: Diagnosis not present

## 2023-04-16 ENCOUNTER — Other Ambulatory Visit (HOSPITAL_BASED_OUTPATIENT_CLINIC_OR_DEPARTMENT_OTHER): Payer: Self-pay

## 2023-04-17 ENCOUNTER — Other Ambulatory Visit (HOSPITAL_BASED_OUTPATIENT_CLINIC_OR_DEPARTMENT_OTHER): Payer: Self-pay

## 2023-04-17 MED ORDER — GLIPIZIDE ER 2.5 MG PO TB24
2.5000 mg | ORAL_TABLET | Freq: Every day | ORAL | 1 refills | Status: DC
  Filled 2023-04-17: qty 90, 90d supply, fill #0
  Filled 2023-06-06 – 2023-07-19 (×2): qty 90, 90d supply, fill #1
  Filled ????-??-??: fill #1

## 2023-04-20 ENCOUNTER — Other Ambulatory Visit (HOSPITAL_BASED_OUTPATIENT_CLINIC_OR_DEPARTMENT_OTHER): Payer: Self-pay

## 2023-04-20 DIAGNOSIS — K649 Unspecified hemorrhoids: Secondary | ICD-10-CM | POA: Diagnosis not present

## 2023-04-20 DIAGNOSIS — Z860100 Personal history of colon polyps, unspecified: Secondary | ICD-10-CM | POA: Diagnosis not present

## 2023-04-20 DIAGNOSIS — Z09 Encounter for follow-up examination after completed treatment for conditions other than malignant neoplasm: Secondary | ICD-10-CM | POA: Diagnosis not present

## 2023-04-24 DIAGNOSIS — I700 Atherosclerosis of aorta: Secondary | ICD-10-CM | POA: Diagnosis not present

## 2023-05-01 ENCOUNTER — Other Ambulatory Visit (HOSPITAL_BASED_OUTPATIENT_CLINIC_OR_DEPARTMENT_OTHER): Payer: Self-pay

## 2023-05-16 DIAGNOSIS — F32A Depression, unspecified: Secondary | ICD-10-CM | POA: Diagnosis not present

## 2023-05-16 DIAGNOSIS — F430 Acute stress reaction: Secondary | ICD-10-CM | POA: Diagnosis not present

## 2023-05-16 DIAGNOSIS — F418 Other specified anxiety disorders: Secondary | ICD-10-CM | POA: Diagnosis not present

## 2023-05-18 DIAGNOSIS — H2511 Age-related nuclear cataract, right eye: Secondary | ICD-10-CM | POA: Diagnosis not present

## 2023-05-28 DIAGNOSIS — H2512 Age-related nuclear cataract, left eye: Secondary | ICD-10-CM | POA: Diagnosis not present

## 2023-05-31 ENCOUNTER — Other Ambulatory Visit (HOSPITAL_BASED_OUTPATIENT_CLINIC_OR_DEPARTMENT_OTHER): Payer: Self-pay

## 2023-06-02 ENCOUNTER — Ambulatory Visit (HOSPITAL_COMMUNITY)
Admission: RE | Admit: 2023-06-02 | Discharge: 2023-06-02 | Disposition: A | Discharge status: Home / Self Care | Payer: Commercial Managed Care - PPO | Source: Ambulatory Visit | Attending: Internal Medicine | Admitting: Internal Medicine

## 2023-06-02 ENCOUNTER — Other Ambulatory Visit (HOSPITAL_BASED_OUTPATIENT_CLINIC_OR_DEPARTMENT_OTHER): Payer: Self-pay

## 2023-06-02 ENCOUNTER — Encounter (HOSPITAL_COMMUNITY): Payer: Self-pay

## 2023-06-02 ENCOUNTER — Ambulatory Visit (INDEPENDENT_AMBULATORY_CARE_PROVIDER_SITE_OTHER): Payer: Commercial Managed Care - PPO

## 2023-06-02 VITALS — BP 130/78 | HR 77 | Temp 98.1°F | Resp 16

## 2023-06-02 DIAGNOSIS — S82114A Nondisplaced fracture of right tibial spine, initial encounter for closed fracture: Secondary | ICD-10-CM | POA: Diagnosis not present

## 2023-06-02 DIAGNOSIS — M25461 Effusion, right knee: Secondary | ICD-10-CM | POA: Diagnosis not present

## 2023-06-02 DIAGNOSIS — W19XXXA Unspecified fall, initial encounter: Secondary | ICD-10-CM

## 2023-06-02 MED ORDER — HYDROCODONE-ACETAMINOPHEN 5-325 MG PO TABS
1.0000 | ORAL_TABLET | Freq: Four times a day (QID) | ORAL | 0 refills | Status: DC | PRN
  Filled 2023-06-02: qty 10, 3d supply, fill #0

## 2023-06-02 NOTE — ED Provider Notes (Signed)
[facility]    CSN: [id number] Arrival date & time: 06/02/23  0901      History   Chief Complaint Chief Complaint  Patient presents with   Appointment   Fall   Knee Injury    HPI Victoria Mooney is a 67 y.o. female.   Patient presents with right knee pain after a fall that occurred yesterday.  Patient reports that she tripped over a basket landing directly on her knee.  Denies hitting head or losing consciousness.  She does not take any blood thinning medications.  Reports that she took ibuprofen and Tylenol for pain.  She is able to bear weight but with difficulty.   Fall    Past Medical History:  Diagnosis Date   Diabetes mellitus without complication (HCC)    Hyperlipidemia     There are no problems to display for this patient.   Past Surgical History:  Procedure Laterality Date   CESAREAN SECTION      OB History   No obstetric history on file.      Home Medications    Prior to Admission medications   Medication Sig Start Date End Date Taking? Authorizing Provider  HYDROcodone-acetaminophen (NORCO/VICODIN) 5-325 MG tablet Take 1 tablet by mouth every 6 (six) hours as needed for severe pain (pain score 7-10). 06/02/23  Yes Ervin Knack E, FNP  aspirin EC 81 MG tablet Take 81 mg by mouth daily. 01/19/23   [provider]  atorvastatin (LIPITOR) 80 MG tablet Take 1 tablet (80 mg total) by mouth daily. 01/19/23     calcium-vitamin D (OSCAL WITH D) 250-125 MG-UNIT tablet Take 1 tablet by mouth daily.    [provider]  citalopram (CELEXA) 20 MG tablet TAKE 1 TABLET BY MOUTH ONCE DAILY Patient taking differently: Take 1.5 tablets by mouth daily. 07/13/20 03/08/23  Sigmund Hazel, MD  citalopram (CELEXA) 20 MG tablet Take 1.5 tablets (30 mg total) by mouth daily. 03/08/23     citalopram (CELEXA) 20 MG tablet Take 1.5 tablets (30 mg total) by mouth daily. 03/22/23     Continuous Glucose Sensor (FREESTYLE LIBRE 2 SENSOR) MISC use as  directed  every 14 (fourteen) days. 04/01/23     Dulaglutide (TRULICITY) 0.75 MG/0.5ML SOPN Inject 0.75 mg into the skin once a week. 04/11/23     Dulaglutide (TRULICITY) 0.75 MG/0.5ML SOPN Inject 0.75 mg into the skin once a week. 04/11/23     ezetimibe (ZETIA) 10 MG tablet Take 1 tablet (10 mg total) by mouth daily. 03/09/23 07/17/23  Yates Decamp, MD  glipiZIDE (GLIPIZIDE XL) 2.5 MG 24 hr tablet Take 1 tablet (2.5 mg total) by mouth daily after breakfast. 04/17/23     glipiZIDE (GLUCOTROL XL) 2.5 MG 24 hr tablet Take 3 tablets by mouth three times a day. 12/03/20     lisinopril-hydrochlorothiazide (ZESTORETIC) 20-12.5 MG tablet Take 1 tablet by mouth daily. 03/08/23   Yates Decamp, MD  metFORMIN (GLUCOPHAGE-XR) 500 MG 24 hr tablet Take 1 tablet (500 mg total) by mouth in the morning AND 1 tablet (500 mg total) every evening. 01/24/23     Multiple Vitamins-Minerals (MULTIVITAMIN WITH MINERALS) tablet Take 1 tablet by mouth daily.    [provider]  polyethylene glycol-electrolytes (TRILYTE) 420 g solution Use as directed. Use with Dulcolax. 01/20/23     zolpidem (AMBIEN) 10 MG tablet Take 1 tablet (10 mg total) by mouth at bedtime as needed. 07/11/22       Family History Family History  Problem Relation Age of Onset   Other Mother        Second degree Mobitz II AV block   Hypertension Mother    Heart attack Mother    Atrial fibrillation Mother    Stroke Mother    Edema Mother    Heart attack Father    Brain cancer Father 66   Heart attack Maternal Grandfather    Dementia Maternal Grandfather    Dementia Paternal Grandmother    Other Paternal Grandfather        Brain tumor   Colon cancer Paternal Grandfather 70    Social History Social History   Tobacco Use   Smoking status: Never   Smokeless tobacco: Never  Vaping Use   Vaping status: Never Used  Substance Use Topics   Alcohol use: Yes    Alcohol/week: 3.0 standard drinks of alcohol    Types: 1 Glasses of wine, 1 Cans of beer, 1  Shots of liquor per week    Comment: 3-4 glasses a week   Drug use: Never     Allergies   Sulfa antibiotics   Review of Systems Review of Systems Per HPI  Physical Exam Triage Vital Signs ED Triage Vitals  Encounter Vitals Group     BP 06/02/23 0920 130/78     Systolic BP Percentile --      Diastolic BP Percentile --      Pulse Rate 06/02/23 0920 77     Resp 06/02/23 0920 16     Temp 06/02/23 0920 98.1 F (36.7 C)     Temp Source 06/02/23 0920 Oral     SpO2 06/02/23 0920 96 %     Weight --      Height --      Head Circumference --      Peak Flow --      Pain Score 06/02/23 0918 1     Pain Loc --      Pain Education --      Exclude from Growth Chart --    No data found.  Updated Vital Signs BP 130/78 (BP Location: Left Arm)   Pulse 77   Temp 98.1 F (36.7 C) (Oral)   Resp 16   LMP  (LMP Unknown)   SpO2 96%   Visual Acuity Right Eye Distance:   Left Eye Distance:   Bilateral Distance:    Right Eye Near:   Left Eye Near:    Bilateral Near:     Physical Exam Constitutional:      General: She is not in acute distress.    Appearance: Normal appearance. She is not toxic-appearing or diaphoretic.  HENT:     Head: Normocephalic and atraumatic.  Eyes:     Extraocular Movements: Extraocular movements intact.     Conjunctiva/sclera: Conjunctivae normal.  Pulmonary:     Effort: Pulmonary effort is normal.  Musculoskeletal:     Comments: Patient has tenderness to palpation to bilateral lateral anterior knee as well as to posterior knee.  No abrasions or lacerations noted.  No swelling or discoloration.  No crepitus noted.  Full range of motion of the knee present.  Neurovascularly intact.  Neurological:     General: No focal deficit present.     Mental Status: She is alert and oriented to person, place, and time. Mental status is at baseline.  Psychiatric:        Mood and Affect: Mood normal.        Behavior: Behavior normal.  Thought Content:  Thought content normal.        Judgment: Judgment normal.      UC Treatments / Results  Labs (all labs ordered are listed, but only abnormal results are displayed) Labs Reviewed - No data to display  EKG   Radiology DG Knee Complete 4 Views Right  Result Date: 06/02/2023 CLINICAL DATA:  Larey Seat yesterday.  Right knee pain. EXAM: RIGHT KNEE - COMPLETE 4+ VIEW COMPARISON:  None Available. FINDINGS: Subtle nondisplaced fracture of the tibial spine. No other evidence of a fracture. Knee joint is normally spaced and aligned. Small joint effusion. IMPRESSION: Subtle nondisplaced fracture of the tibial spine. Small joint effusion. Electronically Signed   By: Amie Portland M.D.   On: 06/02/2023 10:06    Procedures Procedures (including critical care time)  Medications Ordered in UC Medications - No data to display  Initial Impression / Assessment and Plan / UC Course  I have reviewed the triage vital signs and the nursing notes.  Pertinent labs & imaging results that were available during my care of the patient were reviewed by me and considered in my medical decision making (see chart for details).     X-ray is concerning for nondisplaced tibial spine fracture of the right knee.  Patient placed in a knee immobilizer and provided with crutches by clinical staff.  Advised patient of nonweightbearing until otherwise advised.  Advised supportive care including ice application.  Patient advised to follow-up with orthopedist at provided phone number as soon as possible for further evaluation and management.  Patient requesting pain medication so Norco was prescribed for patient.  PDMP reviewed.  Patient reports that she has a prescription for Ambien but has not taken this in quite some time and only takes as needed.  Encouraged her to avoid use with Norco.  Also advised patient that Norco can make her drowsy and do not drive or drink alcohol with taking it.  Educated patient that Norco contains  Tylenol and do not take any additional Tylenol while taking Norco. Patient called for family member to transport her home. Patient verbalized understanding and was agreeable with plan. Final Clinical Impressions(s) / UC Diagnoses   Final diagnoses:  Nondisplaced fracture of right tibial spine, initial encounter for closed fracture  Fall, initial encounter     Discharge Instructions      You have a fracture of your tibial spine of your knee.  You were placed in a knee immobilizer and given crutches.  Do not bear weight and keep knee straight.  Apply ice.  I have prescribed you a medication to take for pain as needed.  Please be advised that it can make you drowsy so do not drive or drink alcohol with it.  Do not take with Ambien.  It does contain Tylenol so do not take any additional Tylenol while taking this medication.  Follow-up with orthopedist at provided phone number as soon as possible.    ED Prescriptions     Medication Sig Dispense Auth. Provider   HYDROcodone-acetaminophen (NORCO/VICODIN) 5-325 MG tablet Take 1 tablet by mouth every 6 (six) hours as needed for severe pain (pain score 7-10). 10 tablet [place], Acie Fredrickson, [place]      I have reviewed the PDMP during this encounter.   Gustavus Bryant, [place] 06/02/23 1109

## 2023-06-02 NOTE — Discharge Instructions (Signed)
You have a fracture of your tibial spine of your knee.  You were placed in a knee immobilizer and given crutches.  Do not bear weight and keep knee straight.  Apply ice.  I have prescribed you a medication to take for pain as needed.  Please be advised that it can make you drowsy so do not drive or drink alcohol with it.  Do not take with Ambien.  It does contain Tylenol so do not take any additional Tylenol while taking this medication.  Follow-up with orthopedist at provided phone number as soon as possible.

## 2023-06-02 NOTE — ED Triage Notes (Signed)
Pt presents after she fell yesterday. C/o right knee pain.

## 2023-06-03 ENCOUNTER — Other Ambulatory Visit (HOSPITAL_BASED_OUTPATIENT_CLINIC_OR_DEPARTMENT_OTHER): Payer: Self-pay

## 2023-06-05 ENCOUNTER — Other Ambulatory Visit: Payer: Self-pay

## 2023-06-05 ENCOUNTER — Encounter: Payer: Self-pay | Admitting: Orthopedic Surgery

## 2023-06-05 ENCOUNTER — Ambulatory Visit: Payer: Commercial Managed Care - PPO | Admitting: Orthopedic Surgery

## 2023-06-05 DIAGNOSIS — S82114D Nondisplaced fracture of right tibial spine, subsequent encounter for closed fracture with routine healing: Secondary | ICD-10-CM

## 2023-06-05 NOTE — Progress Notes (Signed)
Office Visit Note   Patient: Victoria Mooney           Date of Birth: April 24, 1956           MRN: [id number] Visit Date: 06/05/2023 Requested by: Sigmund Hazel, MD [address] [place],  [place] [address] PCP: Sigmund Hazel, MD  Subjective: Chief Complaint  Patient presents with   Right Knee - Injury    DOI:06/01/23    HPI: Victoria Mooney is a 67 y.o. female who presents to the office reporting right knee pain.  Date of injury 06/01/2023.  The patient tripped and fell onto her knee.  Went to [facility] urgent care.  She was diagnosed with tibial spine fracture at that time.  She had any injury after first Thanksgiving dinner and then when she went to a second family's house she did okay with meal prep but then after she got up from that second meal she noted pain and swelling in the right knee.  Went to urgent care the next day where she was diagnosed with minimally displaced tibial spine fracture.  She has been taking Tylenol and ibuprofen for symptoms.  Never problems with the knee before hand.  She has cataract surgery Thursday..                ROS: All systems reviewed are negative as they relate to the chief complaint within the history of present illness.  Patient denies fevers or chills.  Assessment & Plan: Visit Diagnoses:  1. Nondisplaced fracture of right tibial spine, subsequent encounter for closed fracture with routine healing     Plan: Impression is right knee tibial spine avulsion injury with stable knee on exam and fairly minimal to trace effusion.  I think we can change her over to a hinged knee brace and 7-day return with repeat radiographs at that time as well as repeat clinical exam.  I think it is okay for her to walk but I would avoid hills and on level ground.  Do not anticipate that this would require operative intervention unless there is a further injury to the knee.  Follow-Up Instructions: No follow-ups on file.   Orders:  No orders of the  defined types were placed in this encounter.  No orders of the defined types were placed in this encounter.     Procedures: No procedures performed   Clinical Data: No additional findings.  Objective: Vital Signs: LMP  (LMP Unknown)   Physical Exam:  Constitutional: Patient appears well-developed HEENT:  Head: Normocephalic Eyes:EOM are normal Neck: Normal range of motion Cardiovascular: Normal rate Pulmonary/chest: Effort normal Neurologic: Patient is alert Skin: Skin is warm Psychiatric: Patient has normal mood and affect  Ortho Exam: Ortho exam demonstrates full range of motion of the right knee.  Collateral and cruciate ligaments are stable.  No real asymmetry in the laxity with anterior drawer and Lachman right knee versus left knee.  Pedal pulses palpable.  No groin pain with internal/external Tatian of the leg.  The injury was described more as an impact type injury.  Her extensor mechanism is intact.  Specialty Comments:  No specialty comments available.  Imaging: No results found.   PMFS History: There are no problems to display for this patient.  Past Medical History:  Diagnosis Date   Diabetes mellitus without complication (HCC)    Hyperlipidemia     Family History  Problem Relation Age of Onset   Other Mother  Second degree Mobitz II AV block   Hypertension Mother    Heart attack Mother    Atrial fibrillation Mother    Stroke Mother    Edema Mother    Heart attack Father    Brain cancer Father 14   Heart attack Maternal Grandfather    Dementia Maternal Grandfather    Dementia Paternal Grandmother    Other Paternal Grandfather        Brain tumor   Colon cancer Paternal Grandfather 99    Past Surgical History:  Procedure Laterality Date   CESAREAN SECTION     Social History   Occupational History   Not on file  Tobacco Use   Smoking status: Never   Smokeless tobacco: Never  Vaping Use   Vaping status: Never Used  Substance  and Sexual Activity   Alcohol use: Yes    Alcohol/week: 3.0 standard drinks of alcohol    Types: 1 Glasses of wine, 1 Cans of beer, 1 Shots of liquor per week    Comment: 3-4 glasses a week   Drug use: Never   Sexual activity: Not on file

## 2023-06-06 ENCOUNTER — Other Ambulatory Visit (HOSPITAL_BASED_OUTPATIENT_CLINIC_OR_DEPARTMENT_OTHER): Payer: Self-pay

## 2023-06-07 ENCOUNTER — Other Ambulatory Visit (HOSPITAL_BASED_OUTPATIENT_CLINIC_OR_DEPARTMENT_OTHER): Payer: Self-pay

## 2023-06-08 ENCOUNTER — Other Ambulatory Visit (HOSPITAL_BASED_OUTPATIENT_CLINIC_OR_DEPARTMENT_OTHER): Payer: Self-pay

## 2023-06-08 DIAGNOSIS — H2512 Age-related nuclear cataract, left eye: Secondary | ICD-10-CM | POA: Diagnosis not present

## 2023-06-08 MED ORDER — ATORVASTATIN CALCIUM 80 MG PO TABS
80.0000 mg | ORAL_TABLET | Freq: Every day | ORAL | 1 refills | Status: DC
  Filled 2023-06-08: qty 90, 90d supply, fill #0
  Filled 2023-09-02: qty 90, 90d supply, fill #1

## 2023-06-09 ENCOUNTER — Other Ambulatory Visit (HOSPITAL_BASED_OUTPATIENT_CLINIC_OR_DEPARTMENT_OTHER): Payer: Self-pay

## 2023-06-15 ENCOUNTER — Other Ambulatory Visit (HOSPITAL_BASED_OUTPATIENT_CLINIC_OR_DEPARTMENT_OTHER): Payer: Self-pay

## 2023-06-15 MED ORDER — METFORMIN HCL ER 500 MG PO TB24
500.0000 mg | ORAL_TABLET | Freq: Two times a day (BID) | ORAL | 1 refills | Status: DC
  Filled 2023-06-15: qty 90, 45d supply, fill #0
  Filled 2023-07-27: qty 90, 45d supply, fill #1

## 2023-06-16 ENCOUNTER — Other Ambulatory Visit (HOSPITAL_BASED_OUTPATIENT_CLINIC_OR_DEPARTMENT_OTHER): Payer: Self-pay

## 2023-06-16 ENCOUNTER — Other Ambulatory Visit (HOSPITAL_BASED_OUTPATIENT_CLINIC_OR_DEPARTMENT_OTHER): Payer: Self-pay | Admitting: Family Medicine

## 2023-06-16 DIAGNOSIS — R911 Solitary pulmonary nodule: Secondary | ICD-10-CM

## 2023-06-16 DIAGNOSIS — E1165 Type 2 diabetes mellitus with hyperglycemia: Secondary | ICD-10-CM | POA: Diagnosis not present

## 2023-06-17 ENCOUNTER — Other Ambulatory Visit (HOSPITAL_BASED_OUTPATIENT_CLINIC_OR_DEPARTMENT_OTHER): Payer: Self-pay

## 2023-06-19 ENCOUNTER — Other Ambulatory Visit (INDEPENDENT_AMBULATORY_CARE_PROVIDER_SITE_OTHER): Payer: Self-pay

## 2023-06-19 ENCOUNTER — Encounter: Payer: Self-pay | Admitting: Orthopedic Surgery

## 2023-06-19 ENCOUNTER — Other Ambulatory Visit (HOSPITAL_BASED_OUTPATIENT_CLINIC_OR_DEPARTMENT_OTHER): Payer: Self-pay

## 2023-06-19 ENCOUNTER — Ambulatory Visit: Payer: Commercial Managed Care - PPO | Admitting: Orthopedic Surgery

## 2023-06-19 ENCOUNTER — Other Ambulatory Visit (INDEPENDENT_AMBULATORY_CARE_PROVIDER_SITE_OTHER): Payer: Commercial Managed Care - PPO

## 2023-06-19 DIAGNOSIS — S82114D Nondisplaced fracture of right tibial spine, subsequent encounter for closed fracture with routine healing: Secondary | ICD-10-CM | POA: Diagnosis not present

## 2023-06-19 DIAGNOSIS — M25562 Pain in left knee: Secondary | ICD-10-CM | POA: Diagnosis not present

## 2023-06-19 MED ORDER — GLIPIZIDE ER 2.5 MG PO TB24
2.5000 mg | ORAL_TABLET | Freq: Every day | ORAL | 1 refills | Status: DC
  Filled 2023-06-19: qty 30, 30d supply, fill #0
  Filled 2023-06-23: qty 90, 90d supply, fill #0

## 2023-06-19 NOTE — Progress Notes (Signed)
   Post-Op Visit Note   Patient: Victoria Mooney           Date of Birth: 24-Apr-1956           MRN: [id number] Visit Date: 06/19/2023 PCP: Sigmund Hazel, MD   Assessment & Plan:  Chief Complaint:  Chief Complaint  Patient presents with   Right Knee - Follow-up, Fracture    DOI 06/01/2023   Left Knee - Pain    DOI 06/01/2023   Visit Diagnoses:  1. Nondisplaced fracture of right tibial spine, subsequent encounter for closed fracture with routine healing   2. Acute pain of left knee     Plan: D is a patient with right knee tibial eminence fracture.  She has been doing well.  Been in a brace.  3 weeks out now.  Also having some left knee pain.  On exam she has no effusion in the right with full range of motion.  She has good stability to anterior drawer and Lachman on the right.  On the left-hand side full range of motion no effusion stable collateral crucial ligaments with normal radiographs.  Plan at this time is activity as tolerated for both legs.  She wants to get back to doing some sprinting which I discouraged her from doing until the beginning of the year.  Follow-up with [place] as needed.  Follow-Up Instructions: No follow-ups on file.   Orders:  Orders Placed This Encounter  Procedures   XR Knee 1-2 Views Right   XR Knee 1-2 Views Left   No orders of the defined types were placed in this encounter.   Imaging: XR Knee 1-2 Views Left Result Date: 06/19/2023 AP lateral radiographs left knee reviewed.  No fracture.  Alignment normal.  No arthritis.  Normal radiographs left knee  XR Knee 1-2 Views Right Result Date: 06/19/2023 AP lateral radiographs right knee reviewed.  Small tibial eminence fracture again noted with no change in fracture displacement.  Joint space maintained.  No arthritis   PMFS History: There are no active problems to display for this patient.  Past Medical History:  Diagnosis Date   Diabetes mellitus without complication (HCC)     Hyperlipidemia     Family History  Problem Relation Age of Onset   Other Mother        Second degree Mobitz II AV block   Hypertension Mother    Heart attack Mother    Atrial fibrillation Mother    Stroke Mother    Edema Mother    Heart attack Father    Brain cancer Father 10   Heart attack Maternal Grandfather    Dementia Maternal Grandfather    Dementia Paternal Grandmother    Other Paternal Grandfather        Brain tumor   Colon cancer Paternal Grandfather 53    Past Surgical History:  Procedure Laterality Date   CESAREAN SECTION     Social History   Occupational History   Not on file  Tobacco Use   Smoking status: Never   Smokeless tobacco: Never  Vaping Use   Vaping status: Never Used  Substance and Sexual Activity   Alcohol use: Yes    Alcohol/week: 3.0 standard drinks of alcohol    Types: 1 Glasses of wine, 1 Cans of beer, 1 Shots of liquor per week    Comment: 3-4 glasses a week   Drug use: Never   Sexual activity: Not on file

## 2023-06-23 ENCOUNTER — Other Ambulatory Visit (HOSPITAL_BASED_OUTPATIENT_CLINIC_OR_DEPARTMENT_OTHER): Payer: Self-pay

## 2023-06-23 ENCOUNTER — Other Ambulatory Visit: Payer: Self-pay

## 2023-06-23 DIAGNOSIS — E1165 Type 2 diabetes mellitus with hyperglycemia: Secondary | ICD-10-CM | POA: Diagnosis not present

## 2023-06-23 DIAGNOSIS — E785 Hyperlipidemia, unspecified: Secondary | ICD-10-CM | POA: Diagnosis not present

## 2023-06-23 DIAGNOSIS — I10 Essential (primary) hypertension: Secondary | ICD-10-CM | POA: Diagnosis not present

## 2023-06-23 MED ORDER — OZEMPIC (0.25 OR 0.5 MG/DOSE) 2 MG/3ML ~~LOC~~ SOPN
0.2500 mg | PEN_INJECTOR | SUBCUTANEOUS | 3 refills | Status: DC
  Filled 2023-06-23: qty 3, 28d supply, fill #0
  Filled 2023-07-27: qty 3, 28d supply, fill #1
  Filled 2023-08-25: qty 3, 28d supply, fill #2

## 2023-06-23 MED ORDER — GLIPIZIDE ER 2.5 MG PO TB24
2.5000 mg | ORAL_TABLET | Freq: Every day | ORAL | 3 refills | Status: DC
  Filled 2023-06-23 – 2023-10-24 (×2): qty 30, 30d supply, fill #0
  Filled 2023-11-28 – 2024-04-05 (×4): qty 30, 30d supply, fill #1

## 2023-07-07 ENCOUNTER — Other Ambulatory Visit (HOSPITAL_BASED_OUTPATIENT_CLINIC_OR_DEPARTMENT_OTHER): Payer: Self-pay

## 2023-07-07 MED ORDER — CYCLOSPORINE 0.05 % OP EMUL
1.0000 [drp] | Freq: Two times a day (BID) | OPHTHALMIC | 3 refills | Status: AC
  Filled 2023-07-07: qty 180, 90d supply, fill #0
  Filled 2023-10-04: qty 180, 90d supply, fill #1
  Filled 2024-01-25: qty 180, 90d supply, fill #2
  Filled 2024-04-22: qty 180, 90d supply, fill #3

## 2023-07-13 ENCOUNTER — Other Ambulatory Visit (HOSPITAL_COMMUNITY): Payer: Self-pay

## 2023-07-19 ENCOUNTER — Other Ambulatory Visit (HOSPITAL_BASED_OUTPATIENT_CLINIC_OR_DEPARTMENT_OTHER): Payer: Self-pay

## 2023-07-20 ENCOUNTER — Other Ambulatory Visit: Payer: Self-pay

## 2023-07-20 ENCOUNTER — Other Ambulatory Visit (HOSPITAL_BASED_OUTPATIENT_CLINIC_OR_DEPARTMENT_OTHER): Payer: Self-pay

## 2023-07-24 ENCOUNTER — Encounter (HOSPITAL_BASED_OUTPATIENT_CLINIC_OR_DEPARTMENT_OTHER): Payer: Self-pay

## 2023-07-24 ENCOUNTER — Other Ambulatory Visit (HOSPITAL_BASED_OUTPATIENT_CLINIC_OR_DEPARTMENT_OTHER): Payer: Self-pay

## 2023-08-05 ENCOUNTER — Other Ambulatory Visit (HOSPITAL_BASED_OUTPATIENT_CLINIC_OR_DEPARTMENT_OTHER): Payer: Self-pay

## 2023-08-19 ENCOUNTER — Other Ambulatory Visit (HOSPITAL_BASED_OUTPATIENT_CLINIC_OR_DEPARTMENT_OTHER): Payer: Self-pay

## 2023-08-20 ENCOUNTER — Other Ambulatory Visit (HOSPITAL_BASED_OUTPATIENT_CLINIC_OR_DEPARTMENT_OTHER): Payer: Self-pay

## 2023-08-21 ENCOUNTER — Other Ambulatory Visit: Payer: Self-pay

## 2023-08-21 ENCOUNTER — Other Ambulatory Visit (HOSPITAL_BASED_OUTPATIENT_CLINIC_OR_DEPARTMENT_OTHER): Payer: Self-pay

## 2023-08-21 MED ORDER — CITALOPRAM HYDROBROMIDE 20 MG PO TABS
30.0000 mg | ORAL_TABLET | Freq: Every day | ORAL | 0 refills | Status: DC
  Filled 2023-08-21: qty 135, 90d supply, fill #0

## 2023-08-25 ENCOUNTER — Other Ambulatory Visit (HOSPITAL_BASED_OUTPATIENT_CLINIC_OR_DEPARTMENT_OTHER): Payer: Self-pay

## 2023-08-25 ENCOUNTER — Other Ambulatory Visit: Payer: Self-pay

## 2023-09-02 ENCOUNTER — Other Ambulatory Visit: Payer: Self-pay | Admitting: Cardiology

## 2023-09-02 ENCOUNTER — Other Ambulatory Visit (HOSPITAL_BASED_OUTPATIENT_CLINIC_OR_DEPARTMENT_OTHER): Payer: Self-pay

## 2023-09-02 DIAGNOSIS — I10 Essential (primary) hypertension: Secondary | ICD-10-CM

## 2023-09-03 ENCOUNTER — Other Ambulatory Visit: Payer: Self-pay

## 2023-09-04 ENCOUNTER — Other Ambulatory Visit (HOSPITAL_BASED_OUTPATIENT_CLINIC_OR_DEPARTMENT_OTHER): Payer: Self-pay

## 2023-09-04 MED ORDER — LISINOPRIL-HYDROCHLOROTHIAZIDE 20-12.5 MG PO TABS
1.0000 | ORAL_TABLET | Freq: Every day | ORAL | 2 refills | Status: DC
  Filled 2023-09-04: qty 90, 90d supply, fill #0
  Filled 2023-11-24: qty 90, 90d supply, fill #1
  Filled 2024-03-06: qty 90, 90d supply, fill #2

## 2023-09-05 ENCOUNTER — Encounter (HOSPITAL_BASED_OUTPATIENT_CLINIC_OR_DEPARTMENT_OTHER): Payer: Self-pay

## 2023-09-05 ENCOUNTER — Other Ambulatory Visit (HOSPITAL_BASED_OUTPATIENT_CLINIC_OR_DEPARTMENT_OTHER): Payer: Self-pay

## 2023-09-07 ENCOUNTER — Ambulatory Visit: Payer: Commercial Managed Care - PPO | Admitting: Dermatology

## 2023-09-07 ENCOUNTER — Other Ambulatory Visit (HOSPITAL_BASED_OUTPATIENT_CLINIC_OR_DEPARTMENT_OTHER): Payer: Self-pay

## 2023-09-07 ENCOUNTER — Encounter: Payer: Self-pay | Admitting: Dermatology

## 2023-09-07 VITALS — BP 108/71

## 2023-09-07 DIAGNOSIS — D1801 Hemangioma of skin and subcutaneous tissue: Secondary | ICD-10-CM | POA: Diagnosis not present

## 2023-09-07 DIAGNOSIS — L821 Other seborrheic keratosis: Secondary | ICD-10-CM | POA: Diagnosis not present

## 2023-09-07 DIAGNOSIS — W908XXA Exposure to other nonionizing radiation, initial encounter: Secondary | ICD-10-CM | POA: Diagnosis not present

## 2023-09-07 DIAGNOSIS — D229 Melanocytic nevi, unspecified: Secondary | ICD-10-CM

## 2023-09-07 DIAGNOSIS — L578 Other skin changes due to chronic exposure to nonionizing radiation: Secondary | ICD-10-CM

## 2023-09-07 DIAGNOSIS — Z1283 Encounter for screening for malignant neoplasm of skin: Secondary | ICD-10-CM | POA: Diagnosis not present

## 2023-09-07 DIAGNOSIS — L918 Other hypertrophic disorders of the skin: Secondary | ICD-10-CM

## 2023-09-07 DIAGNOSIS — L814 Other melanin hyperpigmentation: Secondary | ICD-10-CM

## 2023-09-07 DIAGNOSIS — D239 Other benign neoplasm of skin, unspecified: Secondary | ICD-10-CM

## 2023-09-07 DIAGNOSIS — L219 Seborrheic dermatitis, unspecified: Secondary | ICD-10-CM

## 2023-09-07 MED ORDER — TRIAMCINOLONE ACETONIDE 0.1 % EX OINT
TOPICAL_OINTMENT | CUTANEOUS | 0 refills | Status: DC
  Filled 2023-09-07: qty 80, 30d supply, fill #0

## 2023-09-07 NOTE — Progress Notes (Signed)
 New Patient Visit   Subjective  Victoria Mooney is a 68 y.o. female who presents for the following:  Total Body Skin Exam (TBSE)  Patient present today for new patient visit for TBSE.The patient reports she has spots, moles and lesions to be evaluated, some may be new or changing and the patient may have concern these could be cancer. Patient has previously been treated by a dermatologist by Dr. Jorja Loa.Patient reports she has hx of bx. Patient denies family history of skin cancers. Patient reports throughout her lifetime has had minimal sun exposure. Currently, patient reports if she has excessive sun exposure, she does apply sunscreen and/or wears protective coverings.  The following portions of the chart were reviewed this encounter and updated as appropriate: medications, allergies, medical history  Review of Systems:  No other skin or systemic complaints except as noted in HPI or Assessment and Plan.  Objective  Well appearing patient in no apparent distress; mood and affect are within normal limits.  A full examination was performed including scalp, head, eyes, ears, nose, lips, neck, chest, axillae, abdomen, back, buttocks, bilateral upper extremities, bilateral lower extremities, hands, feet, fingers, toes, fingernails, and toenails. All findings within normal limits unless otherwise noted below.     Relevant exam findings are noted in the Assessment and Plan.    Assessment & Plan   LENTIGINES, SEBORRHEIC KERATOSES, HEMANGIOMAS - Benign normal skin lesions - Benign-appearing - Call for any changes  BENIGN MELANOCYTIC NEVI - Tan-brown and/or pink-flesh-colored symmetric macules and papules - Benign appearing on exam today - Observation - Call clinic for new or changing moles - Recommend daily use of broad spectrum spf 30+ sunscreen to sun-exposed areas.   MILD ACTINIC DAMAGE - Chronic condition, secondary to cumulative UV/sun exposure - diffuse scaly erythematous  macules with underlying dyspigmentation - Recommend daily broad spectrum sunscreen SPF 30+ to sun-exposed areas, reapply every 2 hours as needed.  - Staying in the shade or wearing long sleeves, sun glasses (UVA+UVB protection) and wide brim hats (4-inch brim around the entire circumference of the hat) are also recommended for sun protection.  - Call for new or changing lesions.  DERMATOFIBROMA Exam: Firm pink/brown papulenodule with dimple sign. Treatment Plan: A dermatofibroma is a benign growth possibly related to trauma, such as an insect bite, cut from shaving, or inflamed acne-type bump.  Treatment options to remove include shave or excision with resulting scar and risk of recurrence.  Since benign-appearing and not bothersome, will observe for now.   Acrochordons (Skin Tags) - Fleshy, skin-colored pedunculated papules on the face - Benign appearing.  - Observe. - If desired, they can be removed with an in office procedure that is not covered by insurance. - Please call the clinic if you notice any new or changing lesions.  SEBORRHEIC DERMATITIS Exam: Pink patches with greasy scale at B/L inner ear  flared  Seborrheic Dermatitis is a chronic persistent rash characterized by pinkness and scaling most commonly of the mid face but also can occur on the scalp (dandruff), ears; mid chest, mid back and groin.  It tends to be exacerbated by stress and cooler weather.  People who have neurologic disease may experience new onset or exacerbation of existing seborrheic dermatitis.  The condition is not curable but treatable and can be controlled.  Treatment Plan: - Rx Triamcinolone 0.1% oint - use BID for 1 week then repeat PRN for flares   SKIN CANCER SCREENING PERFORMED TODAY SEBORRHEIC DERMATITIS   Related Medications  triamcinolone ointment (KENALOG) 0.1 % Use twice daily for 1 week then repeat as needed for flares SKIN EXAM FOR MALIGNANT NEOPLASM   CHERRY  ANGIOMA   LENTIGINES   ACTINIC SKIN DAMAGE   MULTIPLE BENIGN MELANOCYTIC NEVI   SKIN TAG    Return in about 2 years (around 09/06/2025) for TBSE.   Documentation: I have reviewed the above documentation for accuracy and completeness, and I agree with the above.  I, Shirron Marcha Solders, CMA, am acting as scribe for [employer], DO.   Langston Reusing, DO

## 2023-09-07 NOTE — Patient Instructions (Signed)

## 2023-09-14 DIAGNOSIS — E1165 Type 2 diabetes mellitus with hyperglycemia: Secondary | ICD-10-CM | POA: Diagnosis not present

## 2023-09-14 LAB — HEMOGLOBIN A1C: A1c: 7.7

## 2023-09-14 LAB — BASIC METABOLIC PANEL WITH GFR: EGFR: 92

## 2023-09-15 LAB — LAB REPORT - SCANNED: Creatinine, POC: 74.6 mg/dL

## 2023-09-16 ENCOUNTER — Ambulatory Visit
Admission: RE | Admit: 2023-09-16 | Discharge: 2023-09-16 | Disposition: A | Discharge status: Home / Self Care | Source: Ambulatory Visit | Attending: Family Medicine

## 2023-09-16 VITALS — BP 144/70 | HR 70 | Temp 98.1°F | Resp 18

## 2023-09-16 DIAGNOSIS — S61211A Laceration without foreign body of left index finger without damage to nail, initial encounter: Secondary | ICD-10-CM

## 2023-09-16 DIAGNOSIS — M79645 Pain in left finger(s): Secondary | ICD-10-CM | POA: Diagnosis not present

## 2023-09-16 MED ORDER — CEPHALEXIN 500 MG PO CAPS: 500.0000 mg | ORAL_CAPSULE | Freq: Three times a day (TID) | ORAL | 0 refills | Status: DC

## 2023-09-16 MED ORDER — BACITRACIN ZINC 500 UNIT/GM EX OINT: 1.0000 | TOPICAL_OINTMENT | Freq: Two times a day (BID) | CUTANEOUS | 0 refills | Status: DC

## 2023-09-16 NOTE — Discharge Instructions (Signed)
 Start cephalexin to help with preventing wound infection. Change your dressing 2-5 times daily. Every time you change your dressing, clean the wound gently with warm water and Dial antibacterial soap. Pat the wound dry, let it breathe for roughly an hour before covering it back up. When you reapply a dressing, apply Bacitracin ointment to the wound, then cover with non-stick/non-adherent gauze.  After 3-5 days, the wound should scab over nicely and then you don't have to continue doing dressings.  If you will be active with your hand, then apply a dressing to your index finger for that duration.

## 2023-09-16 NOTE — ED Provider Notes (Signed)
 [facility]  Note:  This document was prepared using Conservation officer, historic buildings and may include unintentional dictation errors.  MRN: [id number] DOB: 1955/10/13  Subjective:   Victoria Mooney is a 68 y.o. female presenting for suffering a left index laceration finger nearly 24 hours ago.  This was accidental as patient was handling a plastic highchair for her granddaughter.  Tdap is up-to-date.  Patient has kept the wound clean and dry.  No current facility-administered medications for this encounter.  Current Outpatient Medications:    aspirin EC 81 MG tablet, Take 81 mg by mouth daily., Disp: , Rfl:    atorvastatin (LIPITOR) 80 MG tablet, Take 1 tablet (80 mg total) by mouth daily., Disp: 90 tablet, Rfl: 1   calcium-vitamin D (OSCAL WITH D) 250-125 MG-UNIT tablet, Take 1 tablet by mouth daily., Disp: , Rfl:    citalopram (CELEXA) 20 MG tablet, Take 1.5 tablets (30 mg total) by mouth daily., Disp: 135 tablet, Rfl: 0   Continuous Glucose Sensor (FREESTYLE LIBRE 2 SENSOR) MISC, Use one sensor every 14 (fourteen) days., Disp: 2 each, Rfl: 6   cycloSPORINE (RESTASIS) 0.05 % ophthalmic emulsion, Place 1 drop into both eyes 2 (two) times daily., Disp: 180 each, Rfl: 3   ezetimibe (ZETIA) 10 MG tablet, Take 1 tablet (10 mg total) by mouth daily., Disp: 90 tablet, Rfl: 3   glipiZIDE (GLUCOTROL XL) 2.5 MG 24 hr tablet, Take 3 tablets by mouth three times a day., Disp: 270 tablet, Rfl: 1   lisinopril-hydrochlorothiazide (ZESTORETIC) 20-12.5 MG tablet, Take 1 tablet by mouth daily., Disp: 90 tablet, Rfl: 2   metFORMIN (GLUCOPHAGE-XR) 500 MG 24 hr tablet, Take 1 tablet (500 mg total) by mouth in the morning AND 1 tablet (500 mg total) every evening., Disp: 90 tablet, Rfl: 1   metFORMIN (GLUCOPHAGE-XR) 500 MG 24 hr tablet, Take 1 tablet (500 mg total) by mouth in the morning and 1 tablet in the evening., Disp: 90 tablet, Rfl: 1   Multiple Vitamins-Minerals  (MULTIVITAMIN WITH MINERALS) tablet, Take 1 tablet by mouth daily., Disp: , Rfl:    Semaglutide,0.25 or 0.5MG /DOS, (OZEMPIC, 0.25 OR 0.5 MG/DOSE,) 2 MG/3ML SOPN, Inject 0.25 mg into the skin every 7 (seven) days., Disp: 3 mL, Rfl: 3   triamcinolone ointment (KENALOG) 0.1 %, Use twice daily for 1 week then repeat as needed for flares, Disp: 80 g, Rfl: 0   zolpidem (AMBIEN) 10 MG tablet, Take 1 tablet (10 mg total) by mouth at bedtime as needed., Disp: 30 tablet, Rfl: 0   citalopram (CELEXA) 20 MG tablet, Take 1.5 tablets (30 mg total) by mouth daily., Disp: 135 tablet, Rfl: 0   glipiZIDE (GLIPIZIDE XL) 2.5 MG 24 hr tablet, Take 1 tablet (2.5 mg total) by mouth daily after breakfast., Disp: 90 tablet, Rfl: 1   glipiZIDE (GLIPIZIDE XL) 2.5 MG 24 hr tablet, 1 tablet with breakfast Orally Once a day 30 days, Disp: 30 tablet, Rfl: 3   Allergies  Allergen Reactions   Sulfa Antibiotics     Past Medical History:  Diagnosis Date   Diabetes mellitus without complication (HCC)    Hyperlipidemia      Past Surgical History:  Procedure Laterality Date   CESAREAN SECTION      Family History  Problem Relation Age of Onset   Other Mother        Second degree Mobitz II AV block   Hypertension Mother    Heart attack Mother  Atrial fibrillation Mother    Stroke Mother    Edema Mother    Heart attack Father    Brain cancer Father 89   Heart attack Maternal Grandfather    Dementia Maternal Grandfather    Dementia Paternal Grandmother    Other Paternal Grandfather        Brain tumor   Colon cancer Paternal Grandfather 73    Social History   Tobacco Use   Smoking status: Never   Smokeless tobacco: Never  Vaping Use   Vaping status: Never Used  Substance Use Topics   Alcohol use: Yes    Alcohol/week: 3.0 standard drinks of alcohol    Types: 1 Glasses of wine, 1 Cans of beer, 1 Shots of liquor per week    Comment: 3-4 glasses a week   Drug use: Never    ROS   Objective:    Vitals: BP (!) 144/70 (BP Location: Left Arm)   Pulse 70   Temp 98.1 F (36.7 C) (Oral)   Resp 18   LMP  (LMP Unknown)   SpO2 98%   Physical Exam Constitutional:      General: She is not in acute distress.    Appearance: Normal appearance. She is well-developed. She is not ill-appearing, toxic-appearing or diaphoretic.  HENT:     Head: Normocephalic and atraumatic.     Nose: Nose normal.     Mouth/Throat:     Mouth: Mucous membranes are moist.  Eyes:     General: No scleral icterus.       Right eye: No discharge.        Left eye: No discharge.     Extraocular Movements: Extraocular movements intact.  Cardiovascular:     Rate and Rhythm: Normal rate.  Pulmonary:     Effort: Pulmonary effort is normal.  Musculoskeletal:       Hands:  Skin:    General: Skin is warm and dry.  Neurological:     General: No focal deficit present.     Mental Status: She is alert and oriented to person, place, and time.  Psychiatric:        Mood and Affect: Mood normal.        Behavior: Behavior normal.    Wound cleansed, pressure dressing applied.  Hemostasis achieved.  Assessment and Plan :   PDMP not reviewed this encounter.  1. Finger pain, left   2. Laceration of left index finger without foreign body without damage to nail, initial encounter    Wound must heal by secondary intention.  Wound care reviewed.  Patient reports Tdap is up-to-date.  Will use Keflex for antibiotic prophylaxis. Counseled patient on potential for adverse effects with medications prescribed/recommended today, ER and return-to-clinic precautions discussed, patient verbalized understanding.    Wallis Bamberg, [place] 09/16/23 [id number]

## 2023-09-16 NOTE — ED Triage Notes (Signed)
 Patient presents to the office for L-hand index finger injury. Last TDAP-unknown

## 2023-09-17 ENCOUNTER — Other Ambulatory Visit (HOSPITAL_BASED_OUTPATIENT_CLINIC_OR_DEPARTMENT_OTHER): Payer: Self-pay

## 2023-09-18 ENCOUNTER — Other Ambulatory Visit (HOSPITAL_BASED_OUTPATIENT_CLINIC_OR_DEPARTMENT_OTHER): Payer: Self-pay

## 2023-09-18 ENCOUNTER — Other Ambulatory Visit: Payer: Self-pay

## 2023-09-18 MED ORDER — METFORMIN HCL ER 500 MG PO TB24
500.0000 mg | ORAL_TABLET | Freq: Two times a day (BID) | ORAL | 1 refills | Status: DC
  Filled 2023-09-18: qty 180, 90d supply, fill #0

## 2023-09-19 ENCOUNTER — Other Ambulatory Visit (HOSPITAL_BASED_OUTPATIENT_CLINIC_OR_DEPARTMENT_OTHER): Payer: Self-pay

## 2023-09-21 ENCOUNTER — Other Ambulatory Visit (HOSPITAL_BASED_OUTPATIENT_CLINIC_OR_DEPARTMENT_OTHER): Payer: Self-pay

## 2023-09-21 DIAGNOSIS — E1165 Type 2 diabetes mellitus with hyperglycemia: Secondary | ICD-10-CM | POA: Diagnosis not present

## 2023-09-21 DIAGNOSIS — E785 Hyperlipidemia, unspecified: Secondary | ICD-10-CM | POA: Diagnosis not present

## 2023-09-21 DIAGNOSIS — I10 Essential (primary) hypertension: Secondary | ICD-10-CM | POA: Diagnosis not present

## 2023-09-21 MED ORDER — OZEMPIC (1 MG/DOSE) 4 MG/3ML ~~LOC~~ SOPN
1.0000 mg | PEN_INJECTOR | SUBCUTANEOUS | 3 refills | Status: DC
  Filled 2023-09-21: qty 3, 28d supply, fill #0
  Filled 2023-11-03 – 2023-11-04 (×2): qty 3, 28d supply, fill #1
  Filled 2023-12-05 – 2023-12-20 (×2): qty 3, 28d supply, fill #2

## 2023-09-25 ENCOUNTER — Other Ambulatory Visit: Payer: Self-pay | Admitting: Family Medicine

## 2023-09-25 DIAGNOSIS — Z0000 Encounter for general adult medical examination without abnormal findings: Secondary | ICD-10-CM

## 2023-09-27 ENCOUNTER — Other Ambulatory Visit (HOSPITAL_BASED_OUTPATIENT_CLINIC_OR_DEPARTMENT_OTHER): Payer: Self-pay

## 2023-09-27 DIAGNOSIS — S61211D Laceration without foreign body of left index finger without damage to nail, subsequent encounter: Secondary | ICD-10-CM | POA: Diagnosis not present

## 2023-09-27 MED ORDER — MUPIROCIN 2 % EX OINT
TOPICAL_OINTMENT | CUTANEOUS | 0 refills | Status: AC
  Filled 2023-09-27: qty 22, 5d supply, fill #0

## 2023-10-02 ENCOUNTER — Ambulatory Visit (HOSPITAL_BASED_OUTPATIENT_CLINIC_OR_DEPARTMENT_OTHER)
Admission: RE | Admit: 2023-10-02 | Discharge: 2023-10-02 | Disposition: A | Discharge status: Home / Self Care | Source: Ambulatory Visit | Attending: Family Medicine | Admitting: Family Medicine

## 2023-10-02 DIAGNOSIS — R911 Solitary pulmonary nodule: Secondary | ICD-10-CM | POA: Insufficient documentation

## 2023-10-02 DIAGNOSIS — R918 Other nonspecific abnormal finding of lung field: Secondary | ICD-10-CM | POA: Diagnosis not present

## 2023-10-02 DIAGNOSIS — I700 Atherosclerosis of aorta: Secondary | ICD-10-CM | POA: Diagnosis not present

## 2023-10-04 ENCOUNTER — Other Ambulatory Visit: Payer: Self-pay

## 2023-10-04 ENCOUNTER — Ambulatory Visit
Admission: RE | Admit: 2023-10-04 | Discharge: 2023-10-04 | Disposition: A | Discharge status: Home / Self Care | Source: Ambulatory Visit | Attending: Family Medicine | Admitting: Family Medicine

## 2023-10-04 ENCOUNTER — Other Ambulatory Visit (HOSPITAL_BASED_OUTPATIENT_CLINIC_OR_DEPARTMENT_OTHER): Payer: Self-pay

## 2023-10-04 DIAGNOSIS — Z0000 Encounter for general adult medical examination without abnormal findings: Secondary | ICD-10-CM

## 2023-10-04 DIAGNOSIS — Z1231 Encounter for screening mammogram for malignant neoplasm of breast: Secondary | ICD-10-CM | POA: Diagnosis not present

## 2023-10-05 ENCOUNTER — Other Ambulatory Visit (HOSPITAL_BASED_OUTPATIENT_CLINIC_OR_DEPARTMENT_OTHER): Payer: Self-pay

## 2023-10-05 DIAGNOSIS — Z6825 Body mass index (BMI) 25.0-25.9, adult: Secondary | ICD-10-CM | POA: Diagnosis not present

## 2023-10-05 DIAGNOSIS — F325 Major depressive disorder, single episode, in full remission: Secondary | ICD-10-CM | POA: Diagnosis not present

## 2023-10-07 ENCOUNTER — Other Ambulatory Visit (HOSPITAL_BASED_OUTPATIENT_CLINIC_OR_DEPARTMENT_OTHER): Payer: Self-pay

## 2023-10-24 ENCOUNTER — Other Ambulatory Visit (HOSPITAL_BASED_OUTPATIENT_CLINIC_OR_DEPARTMENT_OTHER): Payer: Self-pay

## 2023-10-25 ENCOUNTER — Other Ambulatory Visit: Payer: Self-pay

## 2023-10-25 ENCOUNTER — Other Ambulatory Visit (HOSPITAL_BASED_OUTPATIENT_CLINIC_OR_DEPARTMENT_OTHER): Payer: Self-pay

## 2023-10-26 ENCOUNTER — Other Ambulatory Visit (HOSPITAL_BASED_OUTPATIENT_CLINIC_OR_DEPARTMENT_OTHER): Payer: Self-pay

## 2023-10-26 ENCOUNTER — Other Ambulatory Visit: Payer: Self-pay

## 2023-10-26 MED ORDER — FREESTYLE LIBRE 2 SENSOR MISC
1.0000 | 11 refills | Status: DC
  Filled 2023-10-26: qty 2, 28d supply, fill #0
  Filled 2023-11-19: qty 2, 28d supply, fill #1
  Filled 2023-12-05 – 2023-12-20 (×3): qty 2, 28d supply, fill #2

## 2023-10-26 MED ORDER — GLIPIZIDE ER 2.5 MG PO TB24
2.5000 mg | ORAL_TABLET | Freq: Every day | ORAL | 1 refills | Status: DC
  Filled 2023-10-26 – 2023-11-24 (×2): qty 90, 90d supply, fill #0
  Filled 2024-02-22: qty 60, 60d supply, fill #1

## 2023-10-27 ENCOUNTER — Other Ambulatory Visit (HOSPITAL_BASED_OUTPATIENT_CLINIC_OR_DEPARTMENT_OTHER): Payer: Self-pay

## 2023-11-04 ENCOUNTER — Other Ambulatory Visit (HOSPITAL_BASED_OUTPATIENT_CLINIC_OR_DEPARTMENT_OTHER): Payer: Self-pay

## 2023-11-06 ENCOUNTER — Other Ambulatory Visit (HOSPITAL_BASED_OUTPATIENT_CLINIC_OR_DEPARTMENT_OTHER): Payer: Self-pay

## 2023-11-06 MED ORDER — INSULIN PEN NEEDLE 32G X 4 MM MISC
1.0000 | 3 refills | Status: DC
  Filled 2023-11-06 (×2): qty 100, 90d supply, fill #0

## 2023-11-17 ENCOUNTER — Other Ambulatory Visit (HOSPITAL_BASED_OUTPATIENT_CLINIC_OR_DEPARTMENT_OTHER): Payer: Self-pay

## 2023-11-20 ENCOUNTER — Other Ambulatory Visit (HOSPITAL_BASED_OUTPATIENT_CLINIC_OR_DEPARTMENT_OTHER): Payer: Self-pay

## 2023-11-20 ENCOUNTER — Other Ambulatory Visit: Payer: Self-pay

## 2023-11-20 MED ORDER — CITALOPRAM HYDROBROMIDE 20 MG PO TABS
20.0000 mg | ORAL_TABLET | Freq: Every day | ORAL | 1 refills | Status: AC
  Filled 2023-11-20: qty 90, 90d supply, fill #0
  Filled 2024-06-03: qty 90, 90d supply, fill #1

## 2023-11-24 ENCOUNTER — Other Ambulatory Visit (HOSPITAL_BASED_OUTPATIENT_CLINIC_OR_DEPARTMENT_OTHER): Payer: Self-pay

## 2023-11-28 ENCOUNTER — Other Ambulatory Visit (HOSPITAL_BASED_OUTPATIENT_CLINIC_OR_DEPARTMENT_OTHER): Payer: Self-pay

## 2023-11-29 ENCOUNTER — Other Ambulatory Visit: Payer: Self-pay

## 2023-11-29 ENCOUNTER — Other Ambulatory Visit (HOSPITAL_BASED_OUTPATIENT_CLINIC_OR_DEPARTMENT_OTHER): Payer: Self-pay

## 2023-11-29 MED ORDER — CITALOPRAM HYDROBROMIDE 20 MG PO TABS
20.0000 mg | ORAL_TABLET | Freq: Every day | ORAL | 0 refills | Status: DC
  Filled 2024-02-22: qty 90, 90d supply, fill #0

## 2023-11-29 MED ORDER — ATORVASTATIN CALCIUM 80 MG PO TABS
80.0000 mg | ORAL_TABLET | Freq: Every day | ORAL | 1 refills | Status: DC
  Filled 2023-11-29: qty 90, 90d supply, fill #0
  Filled 2024-02-22: qty 90, 90d supply, fill #1

## 2023-11-29 MED ORDER — METFORMIN HCL ER 500 MG PO TB24
500.0000 mg | ORAL_TABLET | Freq: Two times a day (BID) | ORAL | 3 refills | Status: DC
  Filled 2023-11-29: qty 180, 90d supply, fill #0
  Filled 2024-02-22: qty 180, 90d supply, fill #1

## 2023-12-06 ENCOUNTER — Other Ambulatory Visit: Payer: Self-pay

## 2023-12-06 ENCOUNTER — Other Ambulatory Visit (HOSPITAL_BASED_OUTPATIENT_CLINIC_OR_DEPARTMENT_OTHER): Payer: Self-pay

## 2023-12-09 ENCOUNTER — Other Ambulatory Visit (HOSPITAL_BASED_OUTPATIENT_CLINIC_OR_DEPARTMENT_OTHER): Payer: Self-pay

## 2023-12-16 ENCOUNTER — Other Ambulatory Visit (HOSPITAL_BASED_OUTPATIENT_CLINIC_OR_DEPARTMENT_OTHER): Payer: Self-pay

## 2023-12-20 ENCOUNTER — Other Ambulatory Visit (HOSPITAL_BASED_OUTPATIENT_CLINIC_OR_DEPARTMENT_OTHER): Payer: Self-pay

## 2023-12-20 DIAGNOSIS — I2510 Atherosclerotic heart disease of native coronary artery without angina pectoris: Secondary | ICD-10-CM | POA: Diagnosis not present

## 2023-12-20 DIAGNOSIS — F418 Other specified anxiety disorders: Secondary | ICD-10-CM | POA: Diagnosis not present

## 2023-12-20 DIAGNOSIS — Z23 Encounter for immunization: Secondary | ICD-10-CM | POA: Diagnosis not present

## 2023-12-20 DIAGNOSIS — Z0000 Encounter for general adult medical examination without abnormal findings: Secondary | ICD-10-CM | POA: Diagnosis not present

## 2023-12-20 DIAGNOSIS — Z6824 Body mass index (BMI) 24.0-24.9, adult: Secondary | ICD-10-CM | POA: Diagnosis not present

## 2023-12-20 DIAGNOSIS — Z13220 Encounter for screening for lipoid disorders: Secondary | ICD-10-CM | POA: Diagnosis not present

## 2023-12-20 DIAGNOSIS — G479 Sleep disorder, unspecified: Secondary | ICD-10-CM | POA: Diagnosis not present

## 2023-12-21 DIAGNOSIS — E1165 Type 2 diabetes mellitus with hyperglycemia: Secondary | ICD-10-CM | POA: Diagnosis not present

## 2023-12-21 LAB — HEMOGLOBIN A1C: A1c: 7.2

## 2023-12-23 ENCOUNTER — Other Ambulatory Visit (HOSPITAL_BASED_OUTPATIENT_CLINIC_OR_DEPARTMENT_OTHER): Payer: Self-pay

## 2023-12-28 ENCOUNTER — Other Ambulatory Visit (HOSPITAL_BASED_OUTPATIENT_CLINIC_OR_DEPARTMENT_OTHER): Payer: Self-pay

## 2023-12-28 DIAGNOSIS — E785 Hyperlipidemia, unspecified: Secondary | ICD-10-CM | POA: Diagnosis not present

## 2023-12-28 DIAGNOSIS — E1165 Type 2 diabetes mellitus with hyperglycemia: Secondary | ICD-10-CM | POA: Diagnosis not present

## 2023-12-28 DIAGNOSIS — I10 Essential (primary) hypertension: Secondary | ICD-10-CM | POA: Diagnosis not present

## 2023-12-28 MED ORDER — FREESTYLE LIBRE 3 PLUS SENSOR MISC
11 refills | Status: DC
  Filled 2023-12-28: qty 2, 30d supply, fill #0
  Filled 2024-01-25: qty 2, 30d supply, fill #1
  Filled 2024-02-22: qty 2, 30d supply, fill #2
  Filled 2024-04-01: qty 2, 30d supply, fill #3
  Filled 2024-04-22: qty 2, 30d supply, fill #4
  Filled 2024-06-03: qty 2, 30d supply, fill #5
  Filled 2024-07-01: qty 2, 30d supply, fill #6

## 2023-12-28 MED ORDER — MOUNJARO 2.5 MG/0.5ML ~~LOC~~ SOAJ
2.5000 mg | SUBCUTANEOUS | 1 refills | Status: DC
  Filled 2023-12-28: qty 2, 28d supply, fill #0

## 2023-12-29 ENCOUNTER — Other Ambulatory Visit (HOSPITAL_BASED_OUTPATIENT_CLINIC_OR_DEPARTMENT_OTHER): Payer: Self-pay

## 2024-01-26 ENCOUNTER — Other Ambulatory Visit (HOSPITAL_BASED_OUTPATIENT_CLINIC_OR_DEPARTMENT_OTHER): Payer: Self-pay

## 2024-01-27 ENCOUNTER — Other Ambulatory Visit (HOSPITAL_BASED_OUTPATIENT_CLINIC_OR_DEPARTMENT_OTHER): Payer: Self-pay

## 2024-02-22 ENCOUNTER — Other Ambulatory Visit (HOSPITAL_BASED_OUTPATIENT_CLINIC_OR_DEPARTMENT_OTHER): Payer: Self-pay

## 2024-02-22 ENCOUNTER — Other Ambulatory Visit: Payer: Self-pay

## 2024-02-22 MED ORDER — GLIPIZIDE ER 2.5 MG PO TB24
2.5000 mg | ORAL_TABLET | Freq: Every day | ORAL | 1 refills | Status: DC
  Filled 2024-02-22: qty 90, 90d supply, fill #0

## 2024-02-23 DIAGNOSIS — E119 Type 2 diabetes mellitus without complications: Secondary | ICD-10-CM | POA: Diagnosis not present

## 2024-02-23 DIAGNOSIS — H18513 Endothelial corneal dystrophy, bilateral: Secondary | ICD-10-CM | POA: Diagnosis not present

## 2024-03-07 ENCOUNTER — Other Ambulatory Visit (HOSPITAL_BASED_OUTPATIENT_CLINIC_OR_DEPARTMENT_OTHER): Payer: Self-pay

## 2024-03-07 ENCOUNTER — Other Ambulatory Visit: Payer: Self-pay

## 2024-03-13 ENCOUNTER — Other Ambulatory Visit (HOSPITAL_BASED_OUTPATIENT_CLINIC_OR_DEPARTMENT_OTHER): Payer: Self-pay

## 2024-03-13 ENCOUNTER — Other Ambulatory Visit: Payer: Self-pay

## 2024-03-13 MED ORDER — METFORMIN HCL ER 500 MG PO TB24
1000.0000 mg | ORAL_TABLET | Freq: Two times a day (BID) | ORAL | 5 refills | Status: AC
  Filled 2024-03-13 – 2024-04-22 (×4): qty 120, 30d supply, fill #0
  Filled 2024-04-24: qty 360, 90d supply, fill #0
  Filled 2024-07-25: qty 360, 90d supply, fill #1

## 2024-03-13 MED ORDER — JARDIANCE 25 MG PO TABS
25.0000 mg | ORAL_TABLET | Freq: Every day | ORAL | 5 refills | Status: AC
  Filled 2024-03-13: qty 30, 30d supply, fill #0
  Filled 2024-04-05 – 2024-10-01 (×2): qty 30, 30d supply, fill #1

## 2024-03-14 ENCOUNTER — Other Ambulatory Visit (HOSPITAL_BASED_OUTPATIENT_CLINIC_OR_DEPARTMENT_OTHER): Payer: Self-pay

## 2024-03-29 DIAGNOSIS — E1165 Type 2 diabetes mellitus with hyperglycemia: Secondary | ICD-10-CM | POA: Diagnosis not present

## 2024-03-29 LAB — HEMOGLOBIN A1C: A1c: 7.9

## 2024-03-29 LAB — COMPREHENSIVE METABOLIC PANEL WITH GFR: EGFR: 69

## 2024-03-30 LAB — LAB REPORT - SCANNED
Albumin, Urine POC: 3.3
Creatinine, POC: 68.2 mg/dL
Microalb Creat Ratio: 5

## 2024-04-01 ENCOUNTER — Other Ambulatory Visit: Payer: Self-pay

## 2024-04-01 ENCOUNTER — Other Ambulatory Visit: Payer: Self-pay | Admitting: Cardiology

## 2024-04-01 DIAGNOSIS — E7800 Pure hypercholesterolemia, unspecified: Secondary | ICD-10-CM

## 2024-04-03 ENCOUNTER — Other Ambulatory Visit: Payer: Self-pay

## 2024-04-03 ENCOUNTER — Other Ambulatory Visit (HOSPITAL_BASED_OUTPATIENT_CLINIC_OR_DEPARTMENT_OTHER): Payer: Self-pay

## 2024-04-03 MED ORDER — EZETIMIBE 10 MG PO TABS
10.0000 mg | ORAL_TABLET | Freq: Every day | ORAL | 0 refills | Status: DC
  Filled 2024-04-03: qty 30, 30d supply, fill #0

## 2024-04-05 ENCOUNTER — Other Ambulatory Visit (HOSPITAL_BASED_OUTPATIENT_CLINIC_OR_DEPARTMENT_OTHER): Payer: Self-pay

## 2024-04-05 ENCOUNTER — Encounter (HOSPITAL_BASED_OUTPATIENT_CLINIC_OR_DEPARTMENT_OTHER): Payer: Self-pay

## 2024-04-05 DIAGNOSIS — E1165 Type 2 diabetes mellitus with hyperglycemia: Secondary | ICD-10-CM | POA: Diagnosis not present

## 2024-04-05 DIAGNOSIS — E785 Hyperlipidemia, unspecified: Secondary | ICD-10-CM | POA: Diagnosis not present

## 2024-04-05 DIAGNOSIS — I10 Essential (primary) hypertension: Secondary | ICD-10-CM | POA: Diagnosis not present

## 2024-04-05 MED ORDER — JANUVIA 25 MG PO TABS
25.0000 mg | ORAL_TABLET | Freq: Every day | ORAL | 1 refills | Status: DC
  Filled 2024-04-05: qty 30, 30d supply, fill #0
  Filled 2024-05-03: qty 30, 30d supply, fill #1
  Filled 2024-05-04: qty 30, 30d supply, fill #0
  Filled 2024-05-04: qty 30, 30d supply, fill #1

## 2024-04-05 MED ORDER — JARDIANCE 25 MG PO TABS
25.0000 mg | ORAL_TABLET | Freq: Every day | ORAL | 1 refills | Status: DC
  Filled 2024-04-05 – 2024-04-08 (×2): qty 90, 90d supply, fill #0
  Filled 2024-05-02: qty 90, 90d supply, fill #1
  Filled 2024-05-03: qty 30, 30d supply, fill #1
  Filled 2024-07-07: qty 90, 90d supply, fill #1

## 2024-04-05 MED ORDER — COMIRNATY 30 MCG/0.3ML IM SUSY
0.3000 mL | PREFILLED_SYRINGE | Freq: Once | INTRAMUSCULAR | 0 refills | Status: AC
  Filled 2024-04-05: qty 0.3, 1d supply, fill #0

## 2024-04-05 MED ORDER — FLUZONE HIGH-DOSE 0.5 ML IM SUSY
0.5000 mL | PREFILLED_SYRINGE | Freq: Once | INTRAMUSCULAR | 0 refills | Status: AC
  Filled 2024-04-05: qty 0.5, 1d supply, fill #0

## 2024-04-06 ENCOUNTER — Encounter (HOSPITAL_BASED_OUTPATIENT_CLINIC_OR_DEPARTMENT_OTHER): Payer: Self-pay

## 2024-04-08 ENCOUNTER — Other Ambulatory Visit: Payer: Self-pay | Admitting: Cardiology

## 2024-04-08 ENCOUNTER — Other Ambulatory Visit (HOSPITAL_BASED_OUTPATIENT_CLINIC_OR_DEPARTMENT_OTHER): Payer: Self-pay

## 2024-04-08 DIAGNOSIS — E7800 Pure hypercholesterolemia, unspecified: Secondary | ICD-10-CM

## 2024-04-12 ENCOUNTER — Other Ambulatory Visit (HOSPITAL_BASED_OUTPATIENT_CLINIC_OR_DEPARTMENT_OTHER): Payer: Self-pay

## 2024-04-22 ENCOUNTER — Other Ambulatory Visit: Payer: Self-pay | Admitting: Cardiology

## 2024-04-22 DIAGNOSIS — E7800 Pure hypercholesterolemia, unspecified: Secondary | ICD-10-CM

## 2024-04-23 ENCOUNTER — Other Ambulatory Visit: Payer: Self-pay

## 2024-04-23 ENCOUNTER — Other Ambulatory Visit (HOSPITAL_BASED_OUTPATIENT_CLINIC_OR_DEPARTMENT_OTHER): Payer: Self-pay

## 2024-04-24 ENCOUNTER — Other Ambulatory Visit (HOSPITAL_BASED_OUTPATIENT_CLINIC_OR_DEPARTMENT_OTHER): Payer: Self-pay

## 2024-04-25 ENCOUNTER — Other Ambulatory Visit (HOSPITAL_BASED_OUTPATIENT_CLINIC_OR_DEPARTMENT_OTHER): Payer: Self-pay

## 2024-04-25 MED ORDER — EZETIMIBE 10 MG PO TABS
10.0000 mg | ORAL_TABLET | Freq: Every day | ORAL | 0 refills | Status: DC
  Filled 2024-04-25 – 2024-04-27 (×2): qty 15, 15d supply, fill #0

## 2024-04-27 ENCOUNTER — Other Ambulatory Visit (HOSPITAL_BASED_OUTPATIENT_CLINIC_OR_DEPARTMENT_OTHER): Payer: Self-pay

## 2024-04-29 ENCOUNTER — Other Ambulatory Visit (HOSPITAL_BASED_OUTPATIENT_CLINIC_OR_DEPARTMENT_OTHER): Payer: Self-pay

## 2024-04-29 ENCOUNTER — Telehealth: Payer: Self-pay | Admitting: Cardiology

## 2024-04-29 DIAGNOSIS — E7800 Pure hypercholesterolemia, unspecified: Secondary | ICD-10-CM

## 2024-04-29 MED ORDER — EZETIMIBE 10 MG PO TABS
10.0000 mg | ORAL_TABLET | Freq: Every day | ORAL | 0 refills | Status: DC
  Filled 2024-05-02: qty 90, 90d supply, fill #0

## 2024-04-29 NOTE — Telephone Encounter (Signed)
*  STAT* If patient is at the pharmacy, call can be transferred to refill team.   1. Which medications need to be refilled? (please list name of each medication and dose if known) ezetimibe  (ZETIA ) 10 MG tablet     4. Which pharmacy/location (including street and city if local pharmacy) is medication to be sent to?  [facility] [place]     5. Do they need a 30 day or 90 day supply? 90    Scheduled 07/19/24

## 2024-04-29 NOTE — Telephone Encounter (Signed)
 Pt scheduled to see Dr. Ladona 07/19/24, 90 day Zetia  has been sent to [facility], per pt's request.

## 2024-05-02 ENCOUNTER — Other Ambulatory Visit (HOSPITAL_BASED_OUTPATIENT_CLINIC_OR_DEPARTMENT_OTHER): Payer: Self-pay

## 2024-05-03 ENCOUNTER — Other Ambulatory Visit (HOSPITAL_BASED_OUTPATIENT_CLINIC_OR_DEPARTMENT_OTHER): Payer: Self-pay

## 2024-05-03 ENCOUNTER — Other Ambulatory Visit: Payer: Self-pay

## 2024-05-04 ENCOUNTER — Other Ambulatory Visit (HOSPITAL_COMMUNITY): Payer: Self-pay

## 2024-05-04 ENCOUNTER — Other Ambulatory Visit (HOSPITAL_BASED_OUTPATIENT_CLINIC_OR_DEPARTMENT_OTHER): Payer: Self-pay

## 2024-05-06 ENCOUNTER — Encounter: Payer: Self-pay | Admitting: Radiology

## 2024-05-13 ENCOUNTER — Encounter: Payer: Self-pay | Admitting: Dietician

## 2024-05-13 ENCOUNTER — Encounter: Attending: Nurse Practitioner | Admitting: Dietician

## 2024-05-13 VITALS — Ht 60.0 in | Wt 124.0 lb

## 2024-05-13 DIAGNOSIS — E119 Type 2 diabetes mellitus without complications: Secondary | ICD-10-CM | POA: Insufficient documentation

## 2024-05-13 NOTE — Progress Notes (Addendum)
 Diabetes Self-Management Education  Visit Type: First/Initial  Appt. Start Time: 0920 Appt. End Time: 1030  05/13/2024  Victoria Mooney, identified by name and date of birth, is a 68 y.o. female with a diagnosis of Diabetes: Type 2.   ASSESSMENT Patient is here today alone.  She was last seen by another RD in the office in 2017. She states that she has been told to take a Glipizide  when blood glucose is >160 but now on Januvia and Jardiance  and has not taken Glipizide  as she is concerned about getting too low. Lost weight on GLP-1 medications and wants to know how to maintain her weight.  She had vomiting and constipation and did not tolerate.  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), HTN, HLD Medications include:  Metformin , Jardiance , Januvia, glipizide  (prn when blood glucose is >160 - patient not taking for fear of hypoglycemia), Vitamin D3, Calcium , fish oil, MVI, vitamin C 125 mg daily Labs:  A1C 7.9% 03/29/2024 increased from 7.2% 12/21/2023, 6.7% 2024 (while on GLP-1), eGFR 92 09/2023 CGM:  Libre 3 current sensor reading 161  CGM Results from download: 05/13/24  % Time CGM active:   96 %   (Goal >70%)  Average glucose:   132 mg/dL for 14 days  Glucose management indicator:   6.5 %  Time in range (70-180 mg/dL):   86 %   (Goal >29%)  Time High (181-250 mg/dL):   10 %   (Goal < 74%)  Time Very High (>250 mg/dL):    2 %   (Goal < 5%)  Time Low (54-69 mg/dL):   2 %   (Goal <5%)  Time Very Low (<54 mg/dL):   0 %   (Goal <8%)  %CV (glucose variability)    0 %  (Goal <36%)   60 124 lbs 05/13/24  Doesn't want to lose more weight. 140 lbs 2023  Lost weight on GLP-1 medications.  Patient lives with her husband.  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  Her husband does the cooking. Walks her dog daily. Has a [employer] membership.  Height 5' (1.524 m), weight 124  lb (56.2 kg). Body mass index is 24.22 kg/m.   Diabetes Self-Management Education - 05/13/24 0956       Visit Information   Visit Type First/Initial      Initial Visit   Diabetes Type Type 2    Date Diagnosed 2005    Are you currently following a meal plan? Yes    What type of meal plan do you follow? DM    Are you taking your medications as prescribed? Yes      Health Coping   How would you rate your overall health? Good      Psychosocial Assessment   Patient Belief/Attitude about Diabetes Motivated to manage diabetes    What is the hardest part about your diabetes right now, causing you the most concern, or is the most worrisome to you about your diabetes?   Making healty food and beverage choices;Getting support / problem solving    Self-care barriers None    Self-management support Doctor's office    Other persons present Patient    Patient Concerns Nutrition/Meal planning;Healthy Lifestyle;Glycemic Control    Special Needs None    Preferred Learning Style No preference indicated    Learning Readiness Ready    How often do you  need to have someone help you when you read instructions, pamphlets, or other written materials from your doctor or pharmacy? 1 - Never    What is the last grade level you completed in school? BS      Pre-Education Assessment   Patient understands the diabetes disease and treatment process. Needs Review    Patient understands incorporating nutritional management into lifestyle. Needs Review    Patient undertands incorporating physical activity into lifestyle. Needs Review    Patient understands using medications safely. Needs Review    Patient understands monitoring blood glucose, interpreting and using results Needs Review    Patient understands prevention, detection, and treatment of acute complications. Needs Review    Patient understands prevention, detection, and treatment of chronic complications. Needs Review    Patient understands how to  develop strategies to address psychosocial issues. Needs Review    Patient understands how to develop strategies to promote health/change behavior. Needs Review      Complications   Last HgB A1C per patient/outside source 7.9 %   03/29/2024   How often do you check your blood sugar? > 4 times/day    Fasting Blood glucose range (mg/dL) 29-870;869-820   879-859   Postprandial Blood glucose range (mg/dL) 819-799;869-820;>799    Number of hypoglycemic episodes per month 6    Can you tell when your blood sugar is low? Yes    What do you do if your blood sugar is low? eat    Number of hyperglycemic episodes ( >200mg /dL): Daily    Can you tell when your blood sugar is high? Yes    What do you do if your blood sugar is high? walk, water    Have you had a dilated eye exam in the past 12 months? Yes    Have you had a dental exam in the past 12 months? Yes    Are you checking your feet? Yes    How many days per week are you checking your feet? 1      Dietary Intake   Breakfast [employer] toast with PB, 1/2 banana, cinnamon    Snack (morning) none    Lunch blueberries, pasta salad, grated cheese    Snack (afternoon) cheese, crackers    Dinner chicken breast, broccoli, cheese, au Gratin potatos, carrots, [employer] bread with margarine    Snack (evening) none but usually carb smart ice cream    Beverage(s) water, half caff coffee with oat milk and coconut creamer, Poppi soda, occasional crystal light, occasional decaf coke zero, occasional Bai water, body armour lite, occasional 1 serving wine      Activity / Exercise   Activity / Exercise Type Light (walking / raking leaves)    How many days per week do you exercise? 7    How many minutes per day do you exercise? 75    Total minutes per week of exercise 525      Patient Education   Previous Diabetes Education Yes   2017   Disease Pathophysiology Definition of diabetes, type 1 and 2, and the diagnosis of diabetes    Healthy Eating Role of diet in the  treatment of diabetes and the relationship between the three main macronutrients and blood glucose level;Food label reading, portion sizes and measuring food.;Plate Method;Information on hints to eating out and maintain blood glucose control.    Being Active Role of exercise on diabetes management, blood pressure control and cardiac health.    Medications Reviewed patients medication for diabetes, action,  purpose, timing of dose and side effects.    Monitoring Taught/evaluated CGM (comment);Identified appropriate SMBG and/or A1C goals.;Daily foot exams    Acute complications Taught prevention, symptoms, and  treatment of hypoglycemia - the 15 rule.;Discussed and identified patients' prevention, symptoms, and treatment of hyperglycemia.    Chronic complications Relationship between chronic complications and blood glucose control    Diabetes Stress and Support Identified and addressed patients feelings and concerns about diabetes;Worked with patient to identify barriers to care and solutions      Individualized Goals (developed by patient)   Nutrition General guidelines for healthy choices and portions discussed    Physical Activity Exercise 5-7 days per week;30 minutes per day    Medications take my medication as prescribed    Monitoring  Consistenly use CGM    Problem Solving Eating Pattern;Addressing barriers to behavior change    Reducing Risk do foot checks daily;examine blood glucose patterns;treat hypoglycemia with 15 grams of carbs if blood glucose less than 70mg /dL      Post-Education Assessment   Patient understands the diabetes disease and treatment process. Comprehends key points    Patient understands incorporating nutritional management into lifestyle. Comprehends key points    Patient undertands incorporating physical activity into lifestyle. Comprehends key points    Patient understands using medications safely. Comphrehends key points    Patient understands monitoring blood  glucose, interpreting and using results Comprehends key points    Patient understands prevention, detection, and treatment of acute complications. Comprehends key points    Patient understands prevention, detection, and treatment of chronic complications. Comprehends key points    Patient understands how to develop strategies to address psychosocial issues. Comprehends key points    Patient understands how to develop strategies to promote health/change behavior. Comprehends key points      Outcomes   Expected Outcomes Demonstrated interest in learning. Expect positive outcomes    Future DMSE PRN          Individualized Plan for Diabetes Self-Management Training:   Learning Objective:  Patient will have a greater understanding of diabetes self-management. Patient education plan is to attend individual and/or group sessions per assessed needs and concerns.   Plan:   Patient Instructions  Plan:  Aim for 2-3 Carb Choices per meal (45 grams) +/- 1 either way  Aim for 0-1 Carbs per snack if hungry  Include protein in moderation with your meals and snacks Consider reading food labels for Total Carbohydrate of foods Consider  increasing your activity level by walking for 60 minutes daily as tolerated - weights as well twice per week Consider checking BG at alternate times per day  Consider taking medication as directed by MD    Expected Outcomes:  Demonstrated interest in learning. Expect positive outcomes  Education material provided: ADA - How to Thrive: A Guide for Your Journey with Diabetes, Food label handouts, Meal plan card, Snack sheet, and Diabetes Resources  If problems or questions, patient to contact team via:  Phone  Future DSME appointment: PRN

## 2024-05-13 NOTE — Patient Instructions (Addendum)
 Plan:  Aim for 2-3 Carb Choices per meal (45 grams) +/- 1 either way  Aim for 0-1 Carbs per snack if hungry  Include protein in moderation with your meals and snacks Consider reading food labels for Total Carbohydrate of foods Consider  increasing your activity level by walking for 60 minutes daily as tolerated - weights as well twice per week Consider checking BG at alternate times per day  Consider taking medication as directed by MD

## 2024-05-15 ENCOUNTER — Other Ambulatory Visit (HOSPITAL_BASED_OUTPATIENT_CLINIC_OR_DEPARTMENT_OTHER): Payer: Self-pay

## 2024-05-15 MED ORDER — JANUVIA 50 MG PO TABS
50.0000 mg | ORAL_TABLET | Freq: Every day | ORAL | 1 refills | Status: DC
  Filled 2024-05-15 – 2024-05-17 (×2): qty 90, 90d supply, fill #0
  Filled 2024-08-19: qty 90, 90d supply, fill #1

## 2024-05-17 ENCOUNTER — Other Ambulatory Visit (HOSPITAL_BASED_OUTPATIENT_CLINIC_OR_DEPARTMENT_OTHER): Payer: Self-pay

## 2024-05-20 ENCOUNTER — Other Ambulatory Visit (HOSPITAL_BASED_OUTPATIENT_CLINIC_OR_DEPARTMENT_OTHER): Payer: Self-pay

## 2024-05-27 ENCOUNTER — Other Ambulatory Visit: Payer: Self-pay | Admitting: Cardiology

## 2024-05-27 ENCOUNTER — Other Ambulatory Visit (HOSPITAL_BASED_OUTPATIENT_CLINIC_OR_DEPARTMENT_OTHER): Payer: Self-pay

## 2024-05-27 DIAGNOSIS — M7918 Myalgia, other site: Secondary | ICD-10-CM | POA: Diagnosis not present

## 2024-05-27 DIAGNOSIS — I10 Essential (primary) hypertension: Secondary | ICD-10-CM

## 2024-05-27 MED ORDER — DICLOFENAC POTASSIUM 50 MG PO TABS
50.0000 mg | ORAL_TABLET | Freq: Three times a day (TID) | ORAL | 0 refills | Status: DC
  Filled 2024-05-27: qty 15, 5d supply, fill #0

## 2024-05-27 MED ORDER — METHOCARBAMOL 750 MG PO TABS
750.0000 mg | ORAL_TABLET | Freq: Three times a day (TID) | ORAL | 0 refills | Status: DC
  Filled 2024-05-27: qty 15, 5d supply, fill #0

## 2024-05-28 ENCOUNTER — Other Ambulatory Visit (HOSPITAL_BASED_OUTPATIENT_CLINIC_OR_DEPARTMENT_OTHER): Payer: Self-pay

## 2024-05-28 MED ORDER — ATORVASTATIN CALCIUM 80 MG PO TABS
80.0000 mg | ORAL_TABLET | Freq: Every day | ORAL | 1 refills | Status: AC
  Filled 2024-05-28: qty 90, 90d supply, fill #0
  Filled 2024-08-27: qty 90, 90d supply, fill #1

## 2024-05-28 MED ORDER — CITALOPRAM HYDROBROMIDE 20 MG PO TABS
20.0000 mg | ORAL_TABLET | Freq: Every day | ORAL | 0 refills | Status: DC
  Filled 2024-05-28: qty 90, 90d supply, fill #0

## 2024-05-28 NOTE — Telephone Encounter (Signed)
 Pt has an upcoming appt in January 2026 with Dr. Ladona and at last office visit Dr. Ladona wanted pt to refill pt's medication lisinopril -hydrochlorothiazide  20-12.5 mg tablet with PCP. Pt is asking Dr. Ladona to refill her medication. Would Dr. Ladona like to refill this medication until appt time in January 2026? Please address

## 2024-05-29 ENCOUNTER — Encounter: Payer: Self-pay | Admitting: Cardiology

## 2024-05-29 ENCOUNTER — Other Ambulatory Visit: Payer: Self-pay

## 2024-05-29 ENCOUNTER — Other Ambulatory Visit (HOSPITAL_BASED_OUTPATIENT_CLINIC_OR_DEPARTMENT_OTHER): Payer: Self-pay

## 2024-05-29 MED ORDER — LISINOPRIL-HYDROCHLOROTHIAZIDE 20-12.5 MG PO TABS
1.0000 | ORAL_TABLET | Freq: Every day | ORAL | 0 refills | Status: DC
  Filled 2024-05-29: qty 90, 90d supply, fill #0

## 2024-06-03 ENCOUNTER — Other Ambulatory Visit: Payer: Self-pay

## 2024-06-03 ENCOUNTER — Other Ambulatory Visit (HOSPITAL_BASED_OUTPATIENT_CLINIC_OR_DEPARTMENT_OTHER): Payer: Self-pay

## 2024-06-07 ENCOUNTER — Other Ambulatory Visit (HOSPITAL_BASED_OUTPATIENT_CLINIC_OR_DEPARTMENT_OTHER): Payer: Self-pay

## 2024-06-07 DIAGNOSIS — R55 Syncope and collapse: Secondary | ICD-10-CM | POA: Diagnosis not present

## 2024-06-07 DIAGNOSIS — S46819A Strain of other muscles, fascia and tendons at shoulder and upper arm level, unspecified arm, initial encounter: Secondary | ICD-10-CM | POA: Diagnosis not present

## 2024-06-07 MED ORDER — METHOCARBAMOL 750 MG PO TABS
750.0000 mg | ORAL_TABLET | Freq: Three times a day (TID) | ORAL | 0 refills | Status: DC
  Filled 2024-06-07: qty 15, 5d supply, fill #0

## 2024-06-07 MED ORDER — DICLOFENAC SODIUM 50 MG PO TBEC
50.0000 mg | DELAYED_RELEASE_TABLET | Freq: Three times a day (TID) | ORAL | 0 refills | Status: DC
  Filled 2024-06-07: qty 15, 5d supply, fill #0

## 2024-06-24 DIAGNOSIS — F418 Other specified anxiety disorders: Secondary | ICD-10-CM | POA: Diagnosis not present

## 2024-06-24 DIAGNOSIS — E782 Mixed hyperlipidemia: Secondary | ICD-10-CM | POA: Diagnosis not present

## 2024-06-24 DIAGNOSIS — Z6824 Body mass index (BMI) 24.0-24.9, adult: Secondary | ICD-10-CM | POA: Diagnosis not present

## 2024-06-24 DIAGNOSIS — I10 Essential (primary) hypertension: Secondary | ICD-10-CM | POA: Diagnosis not present

## 2024-06-24 DIAGNOSIS — F325 Major depressive disorder, single episode, in full remission: Secondary | ICD-10-CM | POA: Diagnosis not present

## 2024-07-01 ENCOUNTER — Other Ambulatory Visit (HOSPITAL_BASED_OUTPATIENT_CLINIC_OR_DEPARTMENT_OTHER): Payer: Self-pay

## 2024-07-03 ENCOUNTER — Other Ambulatory Visit (HOSPITAL_BASED_OUTPATIENT_CLINIC_OR_DEPARTMENT_OTHER): Payer: Self-pay

## 2024-07-08 ENCOUNTER — Other Ambulatory Visit: Payer: Self-pay

## 2024-07-08 ENCOUNTER — Other Ambulatory Visit (HOSPITAL_BASED_OUTPATIENT_CLINIC_OR_DEPARTMENT_OTHER): Payer: Self-pay

## 2024-07-09 ENCOUNTER — Other Ambulatory Visit (HOSPITAL_BASED_OUTPATIENT_CLINIC_OR_DEPARTMENT_OTHER): Payer: Self-pay

## 2024-07-09 MED ORDER — EZETIMIBE 10 MG PO TABS
10.0000 mg | ORAL_TABLET | Freq: Every day | ORAL | 1 refills | Status: AC
  Filled 2024-07-09: qty 90, 90d supply, fill #0

## 2024-07-19 ENCOUNTER — Ambulatory Visit: Attending: Cardiology | Admitting: Cardiology

## 2024-07-19 ENCOUNTER — Other Ambulatory Visit (HOSPITAL_BASED_OUTPATIENT_CLINIC_OR_DEPARTMENT_OTHER): Payer: Self-pay

## 2024-07-19 ENCOUNTER — Encounter: Payer: Self-pay | Admitting: Cardiology

## 2024-07-19 VITALS — BP 106/60 | HR 71 | Resp 16 | Ht 60.0 in | Wt 124.6 lb

## 2024-07-19 DIAGNOSIS — I10 Essential (primary) hypertension: Secondary | ICD-10-CM | POA: Diagnosis not present

## 2024-07-19 DIAGNOSIS — E7800 Pure hypercholesterolemia, unspecified: Secondary | ICD-10-CM | POA: Diagnosis not present

## 2024-07-19 DIAGNOSIS — R931 Abnormal findings on diagnostic imaging of heart and coronary circulation: Secondary | ICD-10-CM

## 2024-07-19 DIAGNOSIS — R42 Dizziness and giddiness: Secondary | ICD-10-CM | POA: Diagnosis not present

## 2024-07-19 MED ORDER — LISINOPRIL 10 MG PO TABS
10.0000 mg | ORAL_TABLET | Freq: Every evening | ORAL | 1 refills | Status: AC
  Filled 2024-07-19: qty 90, 90d supply, fill #0
  Filled 2024-10-01: qty 90, 90d supply, fill #1

## 2024-07-19 NOTE — Patient Instructions (Addendum)
 Medication Instructions:  STOP LISINOPRIL -hydrochlorothiazide   START   lisinopril  (ZESTRIL ) 10 MG tablet         Take 1 tablet (10 mg total) by mouth every evening   *If you need a refill on your cardiac medications before your next appointment, please call your pharmacy*   Follow-Up: At [facility], you and your health needs are our priority.  As part of our continuing mission to provide you with exceptional heart care, our providers are all part of one team.  This team includes your primary Cardiologist (physician) and Advanced Practice Providers or APPs (Physician Assistants and Nurse Practitioners) who all work together to provide you with the care you need, when you need it.  Your next appointment:   As Needed  Provider:   Gordy Bergamo, MD            We recommend signing up for the patient portal called MyChart.  Patients are able to view lab/test results, encounter notes, upcoming appointments, etc.  Non-urgent messages can be sent to your provider as well, go to [email or web address].

## 2024-07-19 NOTE — Progress Notes (Signed)
 " Cardiology Office Note:  .   Date:  07/19/2024  ID:  Victoria Mooney, DOB 02-Sep-1955, MRN [id number] PCP: Cleotilde Planas, MD  [facility] Providers Cardiologist:  Gordy Bergamo, MD   History of Present Illness: .   Victoria Mooney is a 69 y.o. Caucasian female with controlled diabetes mellitus, primary hypertension, abnormal coronary calcium  score in the 81st percentile on 01/13/2023.  She made an appointment to see me due to 1 episode of syncope what appears clearly vasovagal while she was standing up on her feet after a long day felt dizzy woozy and had near syncope.  Since then she has noticed dizziness.  Patient was also last close to 30 pounds with aggressive management of her diabetes.  She continues to remain active, denies chest pain or dyspnea.    Discussed the use of AI scribe software for clinical note transcription with the patient, who gave verbal consent to proceed.  History of Present Illness Victoria Mooney is a 69 year old female with diabetes and hypertension who presents with dizziness and low blood pressure.  She reports dizziness when leaning over and then standing, with one notably severe episode at her retirement party when she felt she might pass out, but no syncope. She continues to have milder dizziness with positional change.  She manages diabetes with Jardiance  25 mg, Januvia  50 mg, and Metformin  (two tablets in the morning and two in the evening). She rarely uses Glipizide  2.5 mg as needed and is not on insulin .  She takes Lisinopril  HCT for hypertension and has had significant recent weight loss. She takes Atorvastatin  and Zetia  for cholesterol and has an elevated coronary calcium  score.  She attributes her weight loss to increased walking and dietary changes and aims to maintain weight while improving muscle mass.  Cardiac Studies relevent.     EKG:   EKG Interpretation Date/Time:  Friday July 19 2024 10:17:34  EST Ventricular Rate:  71 PR Interval:  114 QRS Duration:  84 QT Interval:  404 QTC Calculation: 439 R Axis:   25  Text Interpretation: EKG 07/19/2024: Normal sinus rhythm with sinus arrhythmia, normal EKG. Confirmed by Annalyce Lanpher, Jagadeesh [phone]) on 07/19/2024 10:48:53 AM  Labs    Care everywhere/Faxed External Labs:  Labs 12/23/2023:  Total cholesterol 89, triglycerides 73, HDL 43, LDL 30.  ROS  Review of Systems  Cardiovascular:  Positive for near-syncope. Negative for chest pain, dyspnea on exertion and leg swelling.  Neurological:  Positive for light-headedness.   Physical Exam:   VS:  BP 106/60 (BP Location: Left Arm, Patient Position: Sitting, Cuff Size: Normal)   Pulse 71   Ht 5' (1.524 m)   Wt 124 lb 9.6 oz (56.5 kg)   LMP  (LMP Unknown)   SpO2 94%   BMI 24.33 kg/m    Wt Readings from Last 3 Encounters:  07/19/24 124 lb 9.6 oz (56.5 kg)  05/13/24 124 lb (56.2 kg)  03/08/23 135 lb 3.2 oz (61.3 kg)    BP Readings from Last 3 Encounters:  07/19/24 106/60  09/16/23 (!) 144/70  09/07/23 108/71   Physical Exam Neck:     Vascular: No carotid bruit or JVD.  Cardiovascular:     Rate and Rhythm: Normal rate and regular rhythm.     Pulses: Intact distal pulses.     Heart sounds: Normal heart sounds. No murmur heard.    No gallop.  Pulmonary:     Effort: Pulmonary effort is normal.  Breath sounds: Normal breath sounds.  Abdominal:     General: Bowel sounds are normal.     Palpations: Abdomen is soft.  Musculoskeletal:     Right lower leg: No edema.     Left lower leg: No edema.     ASSESSMENT AND PLAN: .      ICD-10-CM   1. Dizziness  R42 EKG 12-Lead    2. Primary hypertension  I10     3. Pure hypercholesterolemia  E78.00     4. Elevated coronary artery calcium  score 01/13/2023: Total Agatston score 120. MESA database percentile 81.  R93.1      Assessment & Plan Dizziness secondary to antihypertensive therapy and weight loss Dizziness likely due to  hypotension from lisinopril  HCT and significant weight loss. Blood pressure is 106/60 mmHg. Dizziness episodes still reported. - Discontinued lisinopril  HCT 20/12.5 mg. - Initiated lisinopril  10 mg in the evening. - Monitor for further dizziness episodes.  Primary hypertension Hypertension management complicated by significant weight loss. Current blood pressure is 106/60 mmHg, indicating possible resolution of hypertension. - Continue lisinopril  10 mg in the evening. - If dizziness persist, can reduce the dose of lisinopril  to 5 mg daily, consider primary hypertension resolved.  Pure hypercholesterolemia Cholesterol levels are well-controlled with current medication regimen. LDL is at 30 mg/dL. Weight loss may allow for reduction in medication. - Continue atorvastatin  80 mg once daily. - Continue Zetia  10 mg once daily. - Will reassess cholesterol levels in six months with PCP; will consider discontinuing Zetia  if LDL remains low and weight is stable.  Elevated coronary artery calcium  score Asymptomatic with elevated coronary artery calcium  score. Current management with cholesterol medication is appropriate. No stress test or further cardiac evaluation indicated. - Continue current cholesterol management regimen.   Follow up: PRN  Signed,  Gordy Bergamo, MD, [facility] 07/19/2024, 10:52 AM [facility] [address] [place], [place] [address] Phone: [phone]. Fax:  [phone]  "

## 2024-08-07 ENCOUNTER — Ambulatory Visit (HOSPITAL_COMMUNITY)
Admission: EM | Admit: 2024-08-07 | Discharge: 2024-08-07 | Disposition: A | Discharge status: Home / Self Care | Source: Home / Self Care

## 2024-08-07 ENCOUNTER — Encounter (HOSPITAL_COMMUNITY): Payer: Self-pay

## 2024-08-07 DIAGNOSIS — S61012A Laceration without foreign body of left thumb without damage to nail, initial encounter: Secondary | ICD-10-CM

## 2024-08-07 HISTORY — DX: Essential (primary) hypertension: I10

## 2024-08-07 MED ORDER — LIDOCAINE HCL 2 % IJ SOLN
INTRAMUSCULAR | Status: AC
  Filled 2024-08-07: qty 20

## 2024-08-07 MED ORDER — TRAMADOL HCL 50 MG PO TABS: 50.0000 mg | ORAL_TABLET | Freq: Four times a day (QID) | ORAL | 0 refills | Status: AC | PRN

## 2024-08-07 NOTE — ED Notes (Signed)
 Pressure dressing applied.

## 2024-08-07 NOTE — Discharge Instructions (Addendum)
 You have been diagnosed with a laceration to your left thumb.  You received 6 sutures.  You are to return in 10-14 days to have your sutures removed.  You have been provided with suture wound care instructions.  You are encouraged to avoid getting your dressing wet for the next 24 hours.  If you experience any signs of infection you are to return to the urgent care for further evaluation.  This will include drainage from the site redness and swelling or increased pain.

## 2024-08-07 NOTE — ED Provider Notes (Incomplete)
 " [facility]    CSN: [id number] Arrival date & time: 08/07/24  1808      History   Chief Complaint Chief Complaint  Patient presents with   thumb laceration    HPI Victoria Mooney is a 69 y.o. female.   HPI She is in today with her husband for laceration to her left thumb.  She was in the kitchen preparing a sweet potatoes and cut her finger.  She endorses that she does use 81 mg aspirin daily. Past Medical History:  Diagnosis Date   Diabetes mellitus without complication (HCC)    Hyperlipidemia    Hypertension     There are no active problems to display for this patient.   Past Surgical History:  Procedure Laterality Date   CESAREAN SECTION      OB History   No obstetric history on file.      Home Medications    Prior to Admission medications  Medication Sig Start Date End Date Taking? Authorizing Provider  traMADol  (ULTRAM ) 50 MG tablet Take 1 tablet (50 mg total) by mouth every 6 (six) hours as needed. 08/07/24  Yes Myrna Camelia HERO, NP  aspirin EC 81 MG tablet Take 81 mg by mouth daily. 01/19/23   [provider]  atorvastatin  (LIPITOR) 80 MG tablet Take 1 tablet (80 mg total) by mouth daily. 05/28/24     calcium -vitamin D (OSCAL WITH D) 250-125 MG-UNIT tablet Take 1 tablet by mouth daily.    [provider]  citalopram  (CELEXA ) 20 MG tablet Take 1 tablet (20 mg total) by mouth daily. 11/20/23     cycloSPORINE  (RESTASIS ) 0.05 % ophthalmic emulsion Place 1 drop into both eyes 2 (two) times daily. 07/07/23     empagliflozin  (JARDIANCE ) 25 MG TABS tablet Take 1 tablet (25 mg total) by mouth daily. 03/13/24     ezetimibe  (ZETIA ) 10 MG tablet Take 1 tablet (10 mg total) by mouth daily. 07/09/24     glipiZIDE  2.5 MG TABS Take 1 tablet by mouth daily as needed (Hyperglycemia).    [provider]  lisinopril  (ZESTRIL ) 10 MG tablet Take 1 tablet (10 mg total) by mouth every evening. 07/19/24 10/17/24  Ladona Heinz, MD  melatonin 5 MG  TABS Take 5 mg by mouth at bedtime as needed (for sleep).    [provider]  metFORMIN  (GLUCOPHAGE -XR) 500 MG 24 hr tablet Take 2 tablets (1,000 mg total) by mouth 2 (two) times daily in the morning and evening. 03/13/24     Multiple Vitamins-Minerals (MULTIVITAMIN WITH MINERALS) tablet Take 1 tablet by mouth daily.    [provider]  mupirocin  ointment (BACTROBAN ) 2 % Apply 1 application Externally Twice a day 5 days 09/27/23     sitaGLIPtin  (JANUVIA ) 50 MG tablet Take 50 mg by mouth daily.    [provider]    Family History Family History  Problem Relation Age of Onset   Other Mother        Second degree Mobitz II AV block   Hypertension Mother    Heart attack Mother    Atrial fibrillation Mother    Stroke Mother    Edema Mother    Heart attack Father    Brain cancer Father 65   Heart attack Maternal Grandfather    Dementia Maternal Grandfather    Dementia Paternal Grandmother    Other Paternal Grandfather        Brain tumor   Colon cancer Paternal Grandfather 42  Social History Social History[1]   Allergies   Sulfa antibiotics   Review of Systems Review of Systems   Physical Exam Triage Vital Signs ED Triage Vitals [08/07/24 1829]  Encounter Vitals Group     BP      Girls Systolic BP Percentile      Girls Diastolic BP Percentile      Boys Systolic BP Percentile      Boys Diastolic BP Percentile      Pulse      Resp      Temp      Temp src      SpO2      Weight      Height      Head Circumference      Peak Flow      Pain Score 0     Pain Loc      Pain Education      Exclude from Growth Chart    No data found.  Updated Vital Signs LMP  (LMP Unknown)   Visual Acuity Right Eye Distance:   Left Eye Distance:   Bilateral Distance:    Right Eye Near:   Left Eye Near:    Bilateral Near:     Physical Exam   UC Treatments / Results  Labs (all labs ordered are listed, but only abnormal results are displayed) Labs  Reviewed - No data to display  EKG   Radiology No results found.  Procedures Procedures (including critical care time)  Medications Ordered in UC Medications - No data to display  Initial Impression / Assessment and Plan / UC Course  I have reviewed the triage vital signs and the nursing notes.  Pertinent labs & imaging results that were available during my care of the patient were reviewed by me and considered in my medical decision making (see chart for details).     Laceration Final Clinical Impressions(s) / UC Diagnoses   Final diagnoses:  Laceration of left thumb without foreign body without damage to nail, initial encounter     Discharge Instructions      You have been diagnosed with a laceration to your left thumb.  You received 6 sutures.  You are to return in 10-14 days to have your sutures removed.  You have been provided with suture wound care instructions.  You are encouraged to avoid getting your dressing wet for the next 24 hours.  If you experience any signs of infection you are to return to the urgent care for further evaluation.  This will include drainage from the site redness and swelling or increased pain.     ED Prescriptions     Medication Sig Dispense Auth. Provider   traMADol  (ULTRAM ) 50 MG tablet Take 1 tablet (50 mg total) by mouth every 6 (six) hours as needed. 15 tablet Myrna Camelia HERO, NP      I have reviewed the PDMP during this encounter.     [1]  Social History Tobacco Use   Smoking status: Never   Smokeless tobacco: Never  Vaping Use   Vaping status: Never Used  Substance Use Topics   Alcohol use: Yes    Alcohol/week: 3.0 standard drinks of alcohol    Types: 1 Glasses of wine, 1 Cans of beer, 1 Shots of liquor per week    Comment: 3-4 glasses a week   Drug use: Never   "

## 2024-08-07 NOTE — ED Triage Notes (Addendum)
 Patient reports that she was using the wrong knife while cutting sweet potatoes and cut her left thumb. Bleeding at present.  Husband states Tetanus is up to date.  Patient is currently soaking her left thumb in Hibiclens and saline.

## 2024-08-08 ENCOUNTER — Other Ambulatory Visit (HOSPITAL_BASED_OUTPATIENT_CLINIC_OR_DEPARTMENT_OTHER): Payer: Self-pay

## 2024-08-08 MED ORDER — FREESTYLE LIBRE 3 SENSOR MISC
11 refills | Status: AC
  Filled 2024-08-08: qty 2, 28d supply, fill #0
  Filled 2024-08-14 – 2024-08-19 (×2): qty 2, 30d supply, fill #0
  Filled 2024-09-03 – 2024-09-11 (×2): qty 2, 30d supply, fill #1
  Filled 2024-10-01 – 2024-10-08 (×2): qty 2, 30d supply, fill #2

## 2024-08-15 ENCOUNTER — Other Ambulatory Visit: Payer: Self-pay

## 2024-08-15 ENCOUNTER — Other Ambulatory Visit (HOSPITAL_BASED_OUTPATIENT_CLINIC_OR_DEPARTMENT_OTHER): Payer: Self-pay

## 2024-08-19 ENCOUNTER — Other Ambulatory Visit (HOSPITAL_BASED_OUTPATIENT_CLINIC_OR_DEPARTMENT_OTHER): Payer: Self-pay

## 2024-08-19 ENCOUNTER — Other Ambulatory Visit: Payer: Self-pay

## 2024-08-27 ENCOUNTER — Other Ambulatory Visit: Payer: Self-pay

## 2024-08-29 ENCOUNTER — Other Ambulatory Visit: Payer: Self-pay | Admitting: Family Medicine

## 2024-08-29 DIAGNOSIS — Z1231 Encounter for screening mammogram for malignant neoplasm of breast: Secondary | ICD-10-CM

## 2024-09-03 ENCOUNTER — Other Ambulatory Visit: Payer: Self-pay

## 2024-09-04 ENCOUNTER — Other Ambulatory Visit: Payer: Self-pay

## 2024-09-11 ENCOUNTER — Other Ambulatory Visit: Payer: Self-pay

## 2024-10-01 ENCOUNTER — Other Ambulatory Visit: Payer: Self-pay

## 2024-10-02 ENCOUNTER — Other Ambulatory Visit: Payer: Self-pay

## 2024-10-02 DIAGNOSIS — I10 Essential (primary) hypertension: Secondary | ICD-10-CM

## 2024-10-07 ENCOUNTER — Other Ambulatory Visit: Payer: Self-pay

## 2024-10-08 ENCOUNTER — Other Ambulatory Visit: Payer: Self-pay

## 2024-10-08 MED ORDER — ACCU-CHEK GUIDE TEST VI STRP
1.0000 | ORAL_STRIP | Freq: Three times a day (TID) | 11 refills | Status: AC
  Filled 2024-10-08: qty 100, 34d supply, fill #0

## 2024-10-08 MED ORDER — ACCU-CHEK SOFTCLIX LANCETS MISC
1.0000 | Freq: Three times a day (TID) | 11 refills | Status: AC
  Filled 2024-10-08: qty 100, 34d supply, fill #0

## 2024-10-08 MED ORDER — ACCU-CHEK GUIDE W/DEVICE KIT
PACK | 0 refills | Status: AC
  Filled 2024-10-08: qty 1, 30d supply, fill #0

## 2024-10-09 ENCOUNTER — Ambulatory Visit
Admission: RE | Admit: 2024-10-09 | Discharge: 2024-10-09 | Disposition: A | Discharge status: Home / Self Care | Source: Ambulatory Visit | Attending: Family Medicine | Admitting: Family Medicine

## 2024-10-09 DIAGNOSIS — Z1231 Encounter for screening mammogram for malignant neoplasm of breast: Secondary | ICD-10-CM

## 2024-10-11 ENCOUNTER — Other Ambulatory Visit: Payer: Self-pay

## 2024-11-21 ENCOUNTER — Ambulatory Visit: Admitting: Neurology
